# Patient Record
Sex: Female | Born: 1965 | Race: Asian | Hispanic: No | Marital: Married | Smoking: Never smoker
Health system: Southern US, Community
[De-identification: ages and names within clinical notes are randomized; demographics above are authoritative.]

## PROBLEM LIST (undated history)

## (undated) DIAGNOSIS — T7840XA Allergy, unspecified, initial encounter: Secondary | ICD-10-CM

## (undated) HISTORY — DX: Allergy, unspecified, initial encounter: T78.40XA

## (undated) HISTORY — PX: TUBAL LIGATION: SHX77

---

## 2009-03-09 ENCOUNTER — Ambulatory Visit: Payer: Self-pay | Admitting: Internal Medicine

## 2009-03-23 ENCOUNTER — Other Ambulatory Visit: Admission: RE | Admit: 2009-03-23 | Discharge: 2009-03-23 | Payer: Self-pay | Admitting: Endocrinology

## 2009-03-23 ENCOUNTER — Ambulatory Visit: Payer: Self-pay | Admitting: Internal Medicine

## 2009-03-23 LAB — CONVERTED CEMR LAB
Cholesterol: 192 mg/dL (ref 0–200)
HDL: 54.7 mg/dL (ref >39.00)
Pap Smear: NEGATIVE
Total CHOL/HDL Ratio: 4
VLDL: 17.2 mg/dL (ref 0.0–40.0)

## 2009-03-28 ENCOUNTER — Encounter: Payer: Self-pay | Admitting: Internal Medicine

## 2009-04-03 ENCOUNTER — Encounter: Admission: RE | Admit: 2009-04-03 | Discharge: 2009-04-03 | Payer: Self-pay | Admitting: Internal Medicine

## 2009-04-07 ENCOUNTER — Encounter: Payer: Self-pay | Admitting: Internal Medicine

## 2009-04-11 ENCOUNTER — Encounter: Admission: RE | Admit: 2009-04-11 | Discharge: 2009-04-11 | Payer: Self-pay | Admitting: Internal Medicine

## 2010-03-13 NOTE — Assessment & Plan Note (Signed)
Summary: CPX-LB   Vital Signs:  Patient profile:   45 year old female Menstrual status:  regular LMP:     02/18/2009 Height:      65 inches (165.10 cm) Weight:      147.25 pounds (66.93 kg) O2 Sat:      97 % on Room air Temp:     97.1 degrees F (36.17 degrees C) oral Pulse rate:   89 / minute Pulse rhythm:   regular Resp:     16 per minute BP sitting:   110 / 62  (left arm) Cuff size:   large  Vitals Entered By: Josph Macho CMA (March 23, 2009 10:42 AM)  O2 Flow:  Room air CC: Physical/ CF, Preventive Care LMP (date): 02/18/2009     Enter LMP: 02/18/2009   Primary Care Provider:  Etta Grandchild MD  CC:  Physical/ CF and Preventive Care.  History of Present Illness: She returns for a complete physical and states that URI symptoms have resolved.  Preventive Screening-Counseling & Management  Alcohol-Tobacco     Alcohol drinks/day: 0     Smoking Status: never  Hep-HIV-STD-Contraception     Hepatitis Risk: no risk noted     HIV Risk: no risk noted     STD Risk: no risk noted     SBE monthly: yes     SBE Education/Counseling: not applicable      Sexual History:  currently monogamous.        Drug Use:  no.        Blood Transfusions:  no.    Current Medications (verified): 1)  Tussionex Pennkinetic Er 8-10 Mg/91ml Lqcr (Chlorpheniramine-Hydrocodone) .... 2.5-5 Ml By Mouth Two Times A Day As Needed For Cough  Allergies (verified): No Known Drug Allergies  Past History:  Past Medical History: Reviewed history from 03/09/2009 and no changes required. Unremarkable  Past Surgical History: Reviewed history from 03/09/2009 and no changes required. Caesarean section  Family History: Reviewed history from 03/09/2009 and no changes required. Family History Hypertension  Social History: Reviewed history from 03/09/2009 and no changes required. 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  The patient denies anorexia, fever, weight loss, syncope, peripheral edema, prolonged cough, headaches, hemoptysis, abdominal pain, hematuria, suspicious skin lesions, difficulty walking, depression, enlarged lymph nodes, and breast masses.    Physical Exam  General:  Well-developed,well-nourished,in no acute distress; alert,appropriate and cooperative throughout examination Head:  Normocephalic and atraumatic without obvious abnormalities. No apparent alopecia or balding. Ears:  R ear normal and L ear normal.   Nose:  External nasal examination shows no deformity or inflammation. Nasal mucosa are pink and moist without lesions or exudates. Mouth:  Oral mucosa and oropharynx without lesions or exudates.  Teeth in good repair. Neck:  supple, full ROM, no masses, no thyromegaly, no JVD, normal carotid upstroke, and no carotid bruits.   Chest Wall:  No deformities, masses, or tenderness noted. Breasts:  No mass, nodules, thickening, tenderness, bulging, retraction, inflamation, nipple discharge or skin changes noted.   Lungs:  Normal respiratory effort, chest expands symmetrically. Lungs are clear to auscultation, no crackles or wheezes. Heart:  Normal rate and regular rhythm. S1 and S2 normal without gallop, murmur, click, rub or other extra sounds. Abdomen:  Bowel sounds positive,abdomen soft and non-tender without masses, organomegaly or hernias noted. Rectal:  No external abnormalities noted. Normal sphincter tone. No rectal masses or tenderness. Genitalia:  Normal introitus for age, no external lesions, no vaginal discharge, mucosa pink and moist, no vaginal or cervical lesions, no vaginal atrophy, no friaility or hemorrhage, normal uterus size and position, no adnexal masses or tenderness Msk:  No deformity or scoliosis noted of thoracic or lumbar spine.   Pulses:  R and L  carotid,radial,femoral,dorsalis pedis and posterior tibial pulses are full and equal bilaterally Extremities:  No clubbing, cyanosis, edema, or deformity noted with normal full range of motion of all joints.   Neurologic:  No cranial nerve deficits noted. Station and gait are normal. Plantar reflexes are down-going bilaterally. DTRs are symmetrical throughout. Sensory, motor and coordinative functions appear intact. Skin:  Intact without suspicious lesions or rashes Cervical Nodes:  no anterior cervical adenopathy and no posterior cervical adenopathy.   Axillary Nodes:  no R axillary adenopathy and no L axillary adenopathy.   Inguinal Nodes:  no R inguinal adenopathy and no L inguinal adenopathy.   Additional Exam:  EKG is normal.   Impression & Recommendations:  Problem # 1:  ROUTINE GENERAL MEDICAL EXAM@HEALTH  CARE FACL (ICD-V70.0) Assessment New  Discussed using sunscreen, use of alcohol, drug use, self breast exam, routine dental care, routine eye care, schedule for GYN exam, routine physical exam, seat belts, multiple vitamins, osteoporosis prevention, adequate calcium intake in diet, recommendations for immunizations, mammograms and Pap smears.  Discussed exercise and checking cholesterol.    Orders: Obtaining Screening PAP Smear (Q0091) Hemoccult Guaiac-1 spec.(in office) (82270) EKG w/ Interpretation (93000) Venipuncture ([address]) TLB-Lipid Panel (80061-LIPID) Radiology Referral (Radiology)  PAP Screening:    Reviewed PAP smear recommendations:  PAP smear done  Mammogram Screening:    Reviewed Mammogram recommendations:  mammogram ordered  Osteoporosis Risk Assessment:  Risk Factors for Fracture or Low Bone Density:   Race (White or Asian):     yes   Smoking status:       never  Patient Instructions: 1)  Please schedule a follow-up appointment as needed. 2)  It is important that you exercise regularly at least 20 minutes 5 times a week. If you develop chest pain, have  severe difficulty breathing, or feel very tired , stop exercising immediately and seek medical attention. 3)  You need to lose weight. Consider a lower calorie diet and regular exercise.  4)  Schedule your mammogram. 5)  You need to have a Pap Smear to prevent cervical cancer.

## 2010-03-13 NOTE — Letter (Signed)
Summary: Lipid Letter  [facility]  [address] [place], [place] [address]   Phone: [phone]  Fax: [phone]    03/23/2009  Ashley Mccann [address] [place], [place]  [address]  Dear Ashley Mccann:  We have carefully reviewed your last lipid profile from 03/23/2009 and the results are noted below with a summary of recommendations for lipid management.    Cholesterol:       192     Goal: <200   HDL "good" Cholesterol:   02.72     Goal: >40   LDL "bad" Cholesterol:   120     Goal: <130   Triglycerides:       86.0     Goal: <150        TLC Diet (Therapeutic Lifestyle Change): Saturated Fats & Transfatty acids should be kept < 7% of total calories ***Reduce Saturated Fats Polyunstaurated Fat can be up to 10% of total calories Monounsaturated Fat Fat can be up to 20% of total calories Total Fat should be no greater than 25-35% of total calories Carbohydrates should be 50-60% of total calories Protein should be approximately 15% of total calories Fiber should be at least 20-30 grams a day ***Increased fiber may help lower LDL Total Cholesterol should be < 200mg /day Consider adding plant stanol/sterols to diet (example: Benacol spread) ***A higher intake of unsaturated fat may reduce Triglycerides and Increase HDL    Adjunctive Measures (may lower LIPIDS and reduce risk of Heart Attack) include: Aerobic Exercise (20-30 minutes 3-4 times a week) Limit Alcohol Consumption Weight Reduction Aspirin 75-81 mg a day by mouth (if not allergic or contraindicated) Dietary Fiber 20-30 grams a day by mouth     Current Medications:  None If you have any questions, please call. We appreciate being able to work with you.   Sincerely,    [facility] Ashley Grandchild MD

## 2010-03-13 NOTE — Assessment & Plan Note (Signed)
Summary: NEW PT,BCBS,#-LB   Vital Signs:  Patient profile:   45 year old female Menstrual status:  regular LMP:     02/18/2009 Height:      65 inches Weight:      150 pounds BMI:     25.05 O2 Sat:      95 % on Room air Temp:     98.0 degrees F oral Pulse rate:   89 / minute Pulse rhythm:   regular Resp:     16 per minute BP sitting:   108 / 70  (left arm) Cuff size:   large  Vitals Entered By: Rock Nephew CMA (March 09, 2009 4:22 PM)  Nutrition Counseling: Patient's BMI is greater than 25 and therefore counseled on weight management options.  O2 Flow:  Room air  Primary Care Provider:  Etta Grandchild MD  CC:  Cough.  History of Present Illness:  Cough      This is a 45 year old woman who presents with Cough.  The symptoms began 3 weeks ago.  The intensity is described as mild.  The patient reports productive cough, but denies pleuritic chest pain, shortness of breath, wheezing, exertional dyspnea, fever, hemoptysis, and malaise.  Associated symtpoms include cold/URI symptoms.  The patient denies the following symptoms: sore throat, nasal congestion, chronic rhinitis, weight loss, acid reflux symptoms, and peripheral edema.  The cough is worse with cold exposure.    Preventive Screening-Counseling & Management  Alcohol-Tobacco     Alcohol drinks/day: 0     Smoking Status: never  Caffeine-Diet-Exercise     Does Patient Exercise: no  Hep-HIV-STD-Contraception     Hepatitis Risk: no risk noted     HIV Risk: no risk noted     STD Risk: no risk noted     SBE monthly: yes     SBE Education/Counseling: not applicable      Sexual History:  currently monogamous.        Drug Use:  no.        Blood Transfusions:  no.    Medications Prior to Update: 1)  None  Current Medications (verified): 1)  Zithromax Tri-Pak 500 Mg Tab (Azithromycin) .... Take As Directed Once Daily For 3 Days 2)  Tussionex Pennkinetic Er 8-10 Mg/19ml Lqcr (Chlorpheniramine-Hydrocodone) ....  2.5-5 Ml By Mouth Two Times A Day As Needed For Cough  Allergies (verified): No Known Drug Allergies  Past History:  Past Medical History: Unremarkable  Past Surgical History: Caesarean section  Family History: Family History Hypertension  Social History: Occupation: Transport planner at [employer] Never Smoked Alcohol use-no Drug use-no Regular exercise-no Smoking Status:  never Hepatitis Risk:  no risk noted HIV Risk:  no risk noted STD Risk:  no risk noted Sexual History:  currently monogamous Blood Transfusions:  no Drug Use:  no Does Patient Exercise:  no  Review of Systems       The patient complains of prolonged cough.  The patient denies anorexia, fever, weight loss, chest pain, peripheral edema, headaches, hemoptysis, abdominal pain, enlarged lymph nodes, and angioedema.    Physical Exam  General:  Well-developed,well-nourished,in no acute distress; alert,appropriate and cooperative throughout examination Head:  Normocephalic and atraumatic without obvious abnormalities. No apparent alopecia or balding. Ears:  R ear normal and L ear normal.   Mouth:  Oral mucosa and oropharynx without lesions or exudates.  Teeth in good repair. Neck:  supple, full ROM, no masses, no thyromegaly, no JVD, normal carotid upstroke, and no  carotid bruits.   Lungs:  Normal respiratory effort, chest expands symmetrically. Lungs are clear to auscultation, no crackles or wheezes. Heart:  Normal rate and regular rhythm. S1 and S2 normal without gallop, murmur, click, rub or other extra sounds. Abdomen:  soft, non-tender, normal bowel sounds, no distention, no masses, no guarding, no rigidity, no hepatomegaly, and no splenomegaly.   Msk:  No deformity or scoliosis noted of thoracic or lumbar spine.   Pulses:  R and L carotid,radial,femoral,dorsalis pedis and posterior tibial pulses are full and equal bilaterally Extremities:  No clubbing, cyanosis, edema, or deformity noted with normal full  range of motion of all joints.   Neurologic:  No cranial nerve deficits noted. Station and gait are normal. Plantar reflexes are down-going bilaterally. DTRs are symmetrical throughout. Sensory, motor and coordinative functions appear intact. Skin:  Intact without suspicious lesions or rashes Cervical Nodes:  no anterior cervical adenopathy and no posterior cervical adenopathy.   Axillary Nodes:  no R axillary adenopathy and no L axillary adenopathy.   Psych:  Cognition and judgment appear intact. Alert and cooperative with normal attention span and concentration. No apparent delusions, illusions, hallucinations   Impression & Recommendations:  Problem # 1:  BRONCHITIS-ACUTE (ICD-466.0) Assessment New  Her updated medication list for this problem includes:    Zithromax Tri-pak 500 Mg Tab (Azithromycin) .Marland Kitchen... Take as directed once daily for 3 days    Tussionex Pennkinetic Er 8-10 Mg/62ml Lqcr (Chlorpheniramine-hydrocodone) .Marland Kitchen... 2.5-5 ml by mouth two times a day as needed for cough  Take antibiotics and other medications as directed. Encouraged to push clear liquids, get enough rest, and take acetaminophen as needed. To be seen in 5-7 days if no improvement, sooner if worse.  Complete Medication List: 1)  Zithromax Tri-pak 500 Mg Tab (Azithromycin) .... Take as directed once daily for 3 days 2)  Tussionex Pennkinetic Er 8-10 Mg/61ml Lqcr (Chlorpheniramine-hydrocodone) .... 2.5-5 ml by mouth two times a day as needed for cough  Patient Instructions: 1)  Please schedule a follow-up appointment in 2 weeks. 2)  Take your antibiotic as prescribed until ALL of it is gone, but stop if you develop a rash or swelling and contact our office as soon as possible. 3)  Acute bronchitis symptoms for less than 10 days are not helped by antibiotics. take over the counter cough medications. call if no improvment in  5-7 days, sooner if increasing cough, fever, or new symptoms( shortness of breath, chest  pain). Prescriptions: TUSSIONEX PENNKINETIC ER 8-10 MG/5ML LQCR (CHLORPHENIRAMINE-HYDROCODONE) 2.5-5 ml by mouth two times a day as needed for cough  #4 ounces x 0   Entered and Authorized by:   Etta Grandchild MD   Signed by:   Etta Grandchild MD on 03/09/2009   Method used:   Print then Give to Patient   RxID:   [id number] ZITHROMAX TRI-PAK 500 MG TAB (AZITHROMYCIN) Take as directed once daily for 3 days  #3 x 0   Entered and Authorized by:   Etta Grandchild MD   Signed by:   Etta Grandchild MD on 03/09/2009   Method used:   Print then Give to Patient   RxID:   [id number]

## 2010-03-13 NOTE — Letter (Signed)
Summary: Results Follow-up Letter  [facility]  [address] [place], [place] [address]   Phone: [phone]  Fax: [phone]    03/28/2009  [address] [place], [place]  [address]  Dear Ms. MAW,   The following are the results of your recent test(s):  Test     Result     Pap Smear    Normal__x_____  Not Normal_____       Comments: _________________________________________________________ Cholesterol LDL(Bad cholesterol):          Your goal is less than:         HDL (Good cholesterol):        Your goal is more than: _________________________________________________________ Other Tests:   _________________________________________________________  Please call for an appointment Or _________________________________________________________ _________________________________________________________ _________________________________________________________  Sincerely,  Sanda Linger MD [facility]

## 2010-05-10 ENCOUNTER — Other Ambulatory Visit: Payer: Self-pay | Admitting: Internal Medicine

## 2010-06-12 ENCOUNTER — Encounter: Payer: Self-pay | Admitting: Internal Medicine

## 2010-06-14 ENCOUNTER — Encounter: Payer: Self-pay | Admitting: Internal Medicine

## 2010-06-14 ENCOUNTER — Other Ambulatory Visit (INDEPENDENT_AMBULATORY_CARE_PROVIDER_SITE_OTHER): Payer: BC Managed Care – PPO

## 2010-06-14 ENCOUNTER — Ambulatory Visit (INDEPENDENT_AMBULATORY_CARE_PROVIDER_SITE_OTHER): Payer: BC Managed Care – PPO | Admitting: Internal Medicine

## 2010-06-14 ENCOUNTER — Other Ambulatory Visit (HOSPITAL_COMMUNITY)
Admission: RE | Admit: 2010-06-14 | Discharge: 2010-06-14 | Disposition: A | Discharge status: Home / Self Care | Payer: BC Managed Care – PPO | Source: Ambulatory Visit | Attending: Internal Medicine | Admitting: Internal Medicine

## 2010-06-14 VITALS — BP 108/72 | HR 80 | Temp 98.3°F | Resp 16 | Ht 65.0 in | Wt 148.0 lb

## 2010-06-14 DIAGNOSIS — Z1231 Encounter for screening mammogram for malignant neoplasm of breast: Secondary | ICD-10-CM

## 2010-06-14 DIAGNOSIS — J309 Allergic rhinitis, unspecified: Secondary | ICD-10-CM

## 2010-06-14 DIAGNOSIS — Z0000 Encounter for general adult medical examination without abnormal findings: Secondary | ICD-10-CM

## 2010-06-14 DIAGNOSIS — Z01419 Encounter for gynecological examination (general) (routine) without abnormal findings: Secondary | ICD-10-CM | POA: Insufficient documentation

## 2010-06-14 LAB — LIPID PANEL
Cholesterol: 199 mg/dL (ref 0–200)
HDL: 51.1 mg/dL (ref >39.00)
LDL Cholesterol: 129 mg/dL — ABNORMAL HIGH (ref 0–99)
Total CHOL/HDL Ratio: 4

## 2010-06-14 LAB — COMPREHENSIVE METABOLIC PANEL
ALT: 20 U/L (ref 0–35)
AST: 19 U/L (ref 0–37)
Alkaline Phosphatase: 55 U/L (ref 39–117)
BUN: 17 mg/dL (ref 6–23)
Calcium: 8.8 mg/dL (ref 8.4–10.5)
Creatinine, Ser: 0.6 mg/dL (ref 0.4–1.2)
Glucose, Bld: 101 mg/dL — ABNORMAL HIGH (ref 70–99)
Potassium: 4.1 mEq/L (ref 3.5–5.1)
Total Bilirubin: 0.6 mg/dL (ref 0.3–1.2)

## 2010-06-14 LAB — CBC WITH DIFFERENTIAL/PLATELET
Basophils Relative: 0.4 % (ref 0.0–3.0)
Eosinophils Absolute: 0.1 10*3/uL (ref 0.0–0.7)
HCT: 38.7 % (ref 36.0–46.0)
Hemoglobin: 13.3 g/dL (ref 12.0–15.0)
Lymphs Abs: 3 10*3/uL (ref 0.7–4.0)
Monocytes Absolute: 0.4 10*3/uL (ref 0.1–1.0)
RDW: 13 % (ref 11.5–14.6)
WBC: 8.1 10*3/uL (ref 4.5–10.5)

## 2010-06-14 MED ORDER — FEXOFENADINE-PSEUDOEPHED ER 180-240 MG PO TB24: 1.0000 | ORAL_TABLET | Freq: Every day | ORAL | Status: AC

## 2010-06-14 NOTE — Assessment & Plan Note (Signed)
Exam done, labs and PAP smear ordered

## 2010-06-14 NOTE — Assessment & Plan Note (Signed)
mammo ordered.

## 2010-06-14 NOTE — Patient Instructions (Signed)
Allergic Rhinitis Allergic rhinitis is when the mucous membranes in the nose respond to allergens. Allergens are particles in the air that cause your body to have an allergic reaction. This causes you to release allergic antibodies. Through a chain of events, these eventually cause you to release histamine into the blood stream (hence the use of antihistamines). Although meant to be protective to the body, it is this release that causes your discomfort, such as frequent sneezing, congestion and an itchy runny nose.  CAUSES The pollen allergens may come from grasses, trees, and weeds. This is seasonal allergic rhinitis, or "hay fever." Other allergens cause year-round allergic rhinitis (perennial allergic rhinitis) such as house dust mite allergen, pet dander and mold spores.  SYMPTOMS  Nasal stuffiness (congestion).   Runny, itchy nose with sneezing and tearing of the eyes.   There is often an itching of the mouth, eyes and ears.  It cannot be cured, but it can be controlled with medications. DIAGNOSIS If you are unable to determine the offending allergen, skin or blood testing may find it. TREATMENT  Avoid the allergen.   Medications and allergy shots (immunotherapy) can help.   Hay fever may often be treated with antihistamines in pill or nasal spray forms. Antihistamines block the effects of histamine. There are over-the-counter medicines that may help with nasal congestion and swelling around the eyes. Check with your caregiver before taking or giving this medicine.  If the treatment above does not work, there are many new medications your caregiver can prescribe. Stronger medications may be used if initial measures are ineffective. Desensitizing injections can be used if medications and avoidance fails. Desensitization is when a patient is given ongoing shots until the body becomes less sensitive to the allergen. Make sure you follow up with your caregiver if problems continue. SEEK  MEDICAL CARE IF:   You develop fever (more than 100.5F (38.1 C).   You develop a cough that does not stop easily (persistent).   You have shortness of breath.   You start wheezing.   Symptoms interfere with normal daily activities.  Document Released: 10/23/2000 Document Re-Released: 02/19/2009 Exit[employer] Patient Information 2011 [employer]. 

## 2010-06-14 NOTE — Progress Notes (Signed)
Subjective:    Patient ID: Ashley Mccann, female    DOB: 02-08-1966, 45 y.o.   MRN: [id number]  Gynecologic Exam The patient's pertinent negatives include no genital itching, genital lesions, genital odor, genital rash, missed menses, pelvic pain, vaginal bleeding or vaginal discharge. The patient is experiencing no pain. She is not pregnant. Pertinent negatives include no abdominal pain, anorexia, back pain, chills, constipation, diarrhea, discolored urine, dysuria, fever, flank pain, frequency, headaches, hematuria, joint pain, joint swelling, nausea, painful intercourse, rash, sore throat, urgency or vomiting. She is sexually active. No, her partner does not have an STD. She uses tubal ligation for contraception. Her menstrual history has been regular.  Allergic Reaction This is a chronic problem. The current episode started more than 1 week ago. The problem occurs intermittently. The problem has been gradually worsening since onset. The problem is mild. It is unknown what she was exposed to. The time of exposure is unknown. Associated symptoms include eye itching and eye redness. Pertinent negatives include no abdominal pain, chest pain, chest pressure, coughing, diarrhea, difficulty breathing, drooling, globus sensation, hyperventilation, itching, rash, stridor, trouble swallowing, vomiting or wheezing. There is no swelling present. Past treatments include nothing. Her past medical history is significant for seasonal allergies. There is no history of asthma, atopic dermatitis, food allergies or medication allergies.      Review of Systems  Constitutional: Negative for fever, chills, diaphoresis, activity change, appetite change, fatigue and unexpected weight change.  HENT: Positive for congestion, rhinorrhea, sneezing, postnasal drip and tinnitus. Negative for hearing loss, ear pain, nosebleeds, sore throat, facial swelling, drooling, mouth sores, trouble swallowing, neck pain, neck stiffness, dental  problem, voice change, sinus pressure and ear discharge.   Eyes: Positive for redness and itching. Negative for photophobia, pain, discharge and visual disturbance.  Respiratory: Negative for apnea, cough, choking, chest tightness, shortness of breath, wheezing and stridor.   Cardiovascular: Negative for chest pain.  Gastrointestinal: Negative for nausea, vomiting, abdominal pain, diarrhea, constipation, blood in stool, abdominal distention, anal bleeding, rectal pain and anorexia.  Genitourinary: Negative for dysuria, urgency, frequency, hematuria, flank pain, decreased urine volume, vaginal bleeding, vaginal discharge, enuresis, difficulty urinating, pelvic pain, dyspareunia and missed menses.  Musculoskeletal: Negative for myalgias, back pain, joint pain, joint swelling, arthralgias and gait problem.  Skin: Negative for color change, itching, pallor and rash.  Neurological: Negative for dizziness, tremors, seizures, syncope, facial asymmetry, speech difficulty, weakness, light-headedness, numbness and headaches.  Hematological: Negative for adenopathy. Does not bruise/bleed easily.  Psychiatric/Behavioral: Negative for suicidal ideas, hallucinations, behavioral problems, confusion, sleep disturbance, self-injury, dysphoric mood, decreased concentration and agitation. The patient is not nervous/anxious and is not hyperactive.        Objective:   Physical Exam  Vitals reviewed. Constitutional: She is oriented to person, place, and time. She appears well-developed and well-nourished. No distress.  HENT:  Head: Normocephalic and atraumatic.  Right Ear: Hearing, external ear and ear canal normal.  Left Ear: Hearing, tympanic membrane, external ear and ear canal normal.  Nose: Mucosal edema and rhinorrhea present. No nose lacerations, sinus tenderness, nasal deformity, septal deviation or nasal septal hematoma. No epistaxis.  No foreign bodies. Right sinus exhibits no maxillary sinus tenderness  and no frontal sinus tenderness. Left sinus exhibits no maxillary sinus tenderness and no frontal sinus tenderness.  Mouth/Throat: Uvula is midline, oropharynx is clear and moist and mucous membranes are normal. Mucous membranes are not pale, not dry and not cyanotic. No oropharyngeal exudate, posterior oropharyngeal edema, posterior oropharyngeal erythema  or tonsillar abscesses.  Eyes: Conjunctivae and EOM are normal. Pupils are equal, round, and reactive to light. Right eye exhibits no discharge. Left eye exhibits no discharge. No scleral icterus.  Neck: Normal range of motion. Neck supple. No JVD present. No tracheal deviation present. No thyromegaly present.  Cardiovascular: Normal rate, regular rhythm, normal heart sounds and intact distal pulses.  Exam reveals no gallop and no friction rub.   No murmur heard. Pulmonary/Chest: Effort normal and breath sounds normal. No stridor. No respiratory distress. She has no wheezes. She has no rales. She exhibits no tenderness.  Abdominal: Soft. Bowel sounds are normal. She exhibits no distension and no mass. There is no tenderness. There is no rebound and no guarding. Hernia confirmed negative in the right inguinal area and confirmed negative in the left inguinal area.  Genitourinary: Rectum normal, vagina normal and uterus normal. Rectal exam shows no external hemorrhoid, no internal hemorrhoid, no fissure, no mass, no tenderness and anal tone normal. Guaiac negative stool. No breast swelling, tenderness, discharge or bleeding. Pelvic exam was performed with patient supine. No labial fusion. There is no rash, tenderness, lesion or injury on the right labia. There is no rash, tenderness, lesion or injury on the left labia. Uterus is not deviated, not enlarged, not fixed and not tender. Cervix exhibits no motion tenderness, no discharge and no friability. Right adnexum displays no mass, no tenderness and no fullness. Left adnexum displays no mass, no tenderness  and no fullness. No erythema, tenderness or bleeding around the vagina. No foreign body around the vagina. No signs of injury around the vagina. No vaginal discharge found.  Musculoskeletal: Normal range of motion. She exhibits no edema and no tenderness.  Lymphadenopathy:    She has no cervical adenopathy.       Right: No inguinal adenopathy present.       Left: No inguinal adenopathy present.  Neurological: She is alert and oriented to person, place, and time. She has normal reflexes. She displays normal reflexes. No cranial nerve deficit. Coordination normal.  Skin: Skin is warm and dry. No rash noted. She is not diaphoretic. No erythema. No pallor.  Psychiatric: She has a normal mood and affect. Her behavior is normal. Judgment normal.          Assessment & Plan:

## 2010-06-14 NOTE — Assessment & Plan Note (Signed)
Start allerga-d prn

## 2010-06-19 ENCOUNTER — Encounter: Payer: Self-pay | Admitting: Internal Medicine

## 2011-07-26 ENCOUNTER — Other Ambulatory Visit (INDEPENDENT_AMBULATORY_CARE_PROVIDER_SITE_OTHER): Payer: BC Managed Care – PPO

## 2011-07-26 ENCOUNTER — Encounter: Payer: Self-pay | Admitting: Internal Medicine

## 2011-07-26 ENCOUNTER — Ambulatory Visit (INDEPENDENT_AMBULATORY_CARE_PROVIDER_SITE_OTHER): Payer: BC Managed Care – PPO | Admitting: Internal Medicine

## 2011-07-26 VITALS — BP 118/82 | HR 64 | Temp 98.3°F | Resp 16 | Ht 64.0 in | Wt 148.0 lb

## 2011-07-26 DIAGNOSIS — R7309 Other abnormal glucose: Secondary | ICD-10-CM

## 2011-07-26 DIAGNOSIS — Z0000 Encounter for general adult medical examination without abnormal findings: Secondary | ICD-10-CM

## 2011-07-26 DIAGNOSIS — Z1231 Encounter for screening mammogram for malignant neoplasm of breast: Secondary | ICD-10-CM

## 2011-07-26 DIAGNOSIS — E119 Type 2 diabetes mellitus without complications: Secondary | ICD-10-CM | POA: Insufficient documentation

## 2011-07-26 LAB — CBC WITH DIFFERENTIAL/PLATELET
Eosinophils Absolute: 0.1 10*3/uL (ref 0.0–0.7)
Eosinophils Relative: 1.4 % (ref 0.0–5.0)
HCT: 43.6 % (ref 36.0–46.0)
Hemoglobin: 14.5 g/dL (ref 12.0–15.0)
Lymphocytes Relative: 37.5 % (ref 12.0–46.0)
Lymphs Abs: 3.1 10*3/uL (ref 0.7–4.0)
MCV: 88.5 fl (ref 78.0–100.0)
Monocytes Relative: 6.2 % (ref 3.0–12.0)
Neutro Abs: 4.5 10*3/uL (ref 1.4–7.7)
RDW: 13 % (ref 11.5–14.6)
WBC: 8.3 10*3/uL (ref 4.5–10.5)

## 2011-07-26 LAB — LIPID PANEL
Cholesterol: 197 mg/dL (ref 0–200)
LDL Cholesterol: 125 mg/dL — ABNORMAL HIGH (ref 0–99)
Total CHOL/HDL Ratio: 4
VLDL: 18.8 mg/dL (ref 0.0–40.0)

## 2011-07-26 LAB — COMPREHENSIVE METABOLIC PANEL
ALT: 20 U/L (ref 0–35)
AST: 17 U/L (ref 0–37)
Albumin: 4 g/dL (ref 3.5–5.2)
Alkaline Phosphatase: 56 U/L (ref 39–117)
Calcium: 9.2 mg/dL (ref 8.4–10.5)
GFR: 95.8 mL/min (ref >60.00)
Sodium: 139 mEq/L (ref 135–145)
Total Protein: 7.7 g/dL (ref 6.0–8.3)

## 2011-07-26 LAB — TSH: TSH: 1.55 u[IU]/mL (ref 0.35–5.50)

## 2011-07-26 LAB — HEMOGLOBIN A1C: Hgb A1c MFr Bld: 6.9 % — ABNORMAL HIGH (ref 4.6–6.5)

## 2011-07-26 NOTE — Assessment & Plan Note (Signed)
Mammogram ordered, exam done, labs ordered, vaccines were addressed, pt ed material was given

## 2011-07-26 NOTE — Patient Instructions (Addendum)
Preventive Care for Adults, Female A healthy lifestyle and preventive care can promote health and wellness. Preventive health guidelines for women include the following key practices.  A routine yearly physical is a good way to check with your caregiver about your health and preventive screening. It is a chance to share any concerns and updates on your health, and to receive a thorough exam.   Visit your dentist for a routine exam and preventive care every 6 months. Brush your teeth twice a day and floss once a day. Good oral hygiene prevents tooth decay and gum disease.   The frequency of eye exams is based on your age, health, family medical history, use of contact lenses, and other factors. Follow your caregiver's recommendations for frequency of eye exams.   Eat a healthy diet. Foods like vegetables, fruits, whole grains, low-fat dairy products, and lean protein foods contain the nutrients you need without too many calories. Decrease your intake of foods high in solid fats, added sugars, and salt. Eat the right amount of calories for you.Get information about a proper diet from your caregiver, if necessary.   Regular physical exercise is one of the most important things you can do for your health. Most adults should get at least 150 minutes of moderate-intensity exercise (any activity that increases your heart rate and causes you to sweat) each week. In addition, most adults need muscle-strengthening exercises on 2 or more days a week.   Maintain a healthy weight. The body mass index (BMI) is a screening tool to identify possible weight problems. It provides an estimate of body fat based on height and weight. Your caregiver can help determine your BMI, and can help you achieve or maintain a healthy weight.For adults 20 years and older:   A BMI below 18.5 is considered underweight.   A BMI of 18.5 to 24.9 is normal.   A BMI of 25 to 29.9 is considered overweight.   A BMI of 30 and above is  considered obese.   Maintain normal blood lipids and cholesterol levels by exercising and minimizing your intake of saturated fat. Eat a balanced diet with plenty of fruit and vegetables. Blood tests for lipids and cholesterol should begin at age 20 and be repeated every 5 years. If your lipid or cholesterol levels are high, you are over 50, or you are at high risk for heart disease, you may need your cholesterol levels checked more frequently.Ongoing high lipid and cholesterol levels should be treated with medicines if diet and exercise are not effective.   If you smoke, find out from your caregiver how to quit. If you do not use tobacco, do not start.   If you are pregnant, do not drink alcohol. If you are breastfeeding, be very cautious about drinking alcohol. If you are not pregnant and choose to drink alcohol, do not exceed 1 drink per day. One drink is considered to be 12 ounces (355 mL) of beer, 5 ounces (148 mL) of wine, or 1.5 ounces (44 mL) of liquor.   Avoid use of street drugs. Do not share needles with anyone. Ask for help if you need support or instructions about stopping the use of drugs.   High blood pressure causes heart disease and increases the risk of stroke. Your blood pressure should be checked at least every 1 to 2 years. Ongoing high blood pressure should be treated with medicines if weight loss and exercise are not effective.   If you are 55 to 46   years old, ask your caregiver if you should take aspirin to prevent strokes.   Diabetes screening involves taking a blood sample to check your fasting blood sugar level. This should be done once every 3 years, after age 45, if you are within normal weight and without risk factors for diabetes. Testing should be considered at a younger age or be carried out more frequently if you are overweight and have at least 1 risk factor for diabetes.   Breast cancer screening is essential preventive care for women. You should practice "breast  self-awareness." This means understanding the normal appearance and feel of your breasts and may include breast self-examination. Any changes detected, no matter how small, should be reported to a caregiver. Women in their 20s and 30s should have a clinical breast exam (CBE) by a caregiver as part of a regular health exam every 1 to 3 years. After age 40, women should have a CBE every year. Starting at age 40, women should consider having a mammography (breast X-ray test) every year. Women who have a family history of breast cancer should talk to their caregiver about genetic screening. Women at a high risk of breast cancer should talk to their caregivers about having magnetic resonance imaging (MRI) and a mammography every year.   The Pap test is a screening test for cervical cancer. A Pap test can show cell changes on the cervix that might become cervical cancer if left untreated. A Pap test is a procedure in which cells are obtained and examined from the lower end of the uterus (cervix).   Women should have a Pap test starting at age 21.   Between ages 21 and 29, Pap tests should be repeated every 2 years.   Beginning at age 30, you should have a Pap test every 3 years as long as the past 3 Pap tests have been normal.   Some women have medical problems that increase the chance of getting cervical cancer. Talk to your caregiver about these problems. It is especially important to talk to your caregiver if a new problem develops soon after your last Pap test. In these cases, your caregiver may recommend more frequent screening and Pap tests.   The above recommendations are the same for women who have or have not gotten the vaccine for human papillomavirus (HPV).   If you had a hysterectomy for a problem that was not cancer or a condition that could lead to cancer, then you no longer need Pap tests. Even if you no longer need a Pap test, a regular exam is a good idea to make sure no other problems are  starting.   If you are between ages 65 and 70, and you have had normal Pap tests going back 10 years, you no longer need Pap tests. Even if you no longer need a Pap test, a regular exam is a good idea to make sure no other problems are starting.   If you have had past treatment for cervical cancer or a condition that could lead to cancer, you need Pap tests and screening for cancer for at least 20 years after your treatment.   If Pap tests have been discontinued, risk factors (such as a new sexual partner) need to be reassessed to determine if screening should be resumed.   The HPV test is an additional test that may be used for cervical cancer screening. The HPV test looks for the virus that can cause the cell changes on the cervix.   The cells collected during the Pap test can be tested for HPV. The HPV test could be used to screen women aged 30 years and older, and should be used in women of any age who have unclear Pap test results. After the age of 30, women should have HPV testing at the same frequency as a Pap test.   Colorectal cancer can be detected and often prevented. Most routine colorectal cancer screening begins at the age of 50 and continues through age 75. However, your caregiver may recommend screening at an earlier age if you have risk factors for colon cancer. On a yearly basis, your caregiver may provide home test kits to check for hidden blood in the stool. Use of a small camera at the end of a tube, to directly examine the colon (sigmoidoscopy or colonoscopy), can detect the earliest forms of colorectal cancer. Talk to your caregiver about this at age 50, when routine screening begins. Direct examination of the colon should be repeated every 5 to 10 years through age 75, unless early forms of pre-cancerous polyps or small growths are found.   Hepatitis C blood testing is recommended for all people born from 1945 through 1965 and any individual with known risks for hepatitis C.    Practice safe sex. Use condoms and avoid high-risk sexual practices to reduce the spread of sexually transmitted infections (STIs). STIs include gonorrhea, chlamydia, syphilis, trichomonas, herpes, HPV, and human immunodeficiency virus (HIV). Herpes, HIV, and HPV are viral illnesses that have no cure. They can result in disability, cancer, and death. Sexually active women aged 25 and younger should be checked for chlamydia. Older women with new or multiple partners should also be tested for chlamydia. Testing for other STIs is recommended if you are sexually active and at increased risk.   Osteoporosis is a disease in which the bones lose minerals and strength with aging. This can result in serious bone fractures. The risk of osteoporosis can be identified using a bone density scan. Women ages 65 and over and women at risk for fractures or osteoporosis should discuss screening with their caregivers. Ask your caregiver whether you should take a calcium supplement or vitamin D to reduce the rate of osteoporosis.   Menopause can be associated with physical symptoms and risks. Hormone replacement therapy is available to decrease symptoms and risks. You should talk to your caregiver about whether hormone replacement therapy is right for you.   Use sunscreen with sun protection factor (SPF) of 30 or more. Apply sunscreen liberally and repeatedly throughout the day. You should seek shade when your shadow is shorter than you. Protect yourself by wearing long sleeves, pants, a wide-brimmed hat, and sunglasses year round, whenever you are outdoors.   Once a month, do a whole body skin exam, using a mirror to look at the skin on your back. Notify your caregiver of new moles, moles that have irregular borders, moles that are larger than a pencil eraser, or moles that have changed in shape or color.   Stay current with required immunizations.   Influenza. You need a dose every fall (or winter). The composition of  the flu vaccine changes each year, so being vaccinated once is not enough.   Pneumococcal polysaccharide. You need 1 to 2 doses if you smoke cigarettes or if you have certain chronic medical conditions. You need 1 dose at age 65 (or older) if you have never been vaccinated.   Tetanus, diphtheria, pertussis (Tdap, Td). Get 1 dose of   Tdap vaccine if you are younger than age 65, are over 65 and have contact with an infant, are a healthcare worker, are pregnant, or simply want to be protected from whooping cough. After that, you need a Td booster dose every 10 years. Consult your caregiver if you have not had at least 3 tetanus and diphtheria-containing shots sometime in your life or have a deep or dirty wound.   HPV. You need this vaccine if you are a woman age 26 or younger. The vaccine is given in 3 doses over 6 months.   Measles, mumps, rubella (MMR). You need at least 1 dose of MMR if you were born in 1957 or later. You may also need a second dose.   Meningococcal. If you are age 19 to 21 and a first-year college student living in a residence hall, or have one of several medical conditions, you need to get vaccinated against meningococcal disease. You may also need additional booster doses.   Zoster (shingles). If you are age 60 or older, you should get this vaccine.   Varicella (chickenpox). If you have never had chickenpox or you were vaccinated but received only 1 dose, talk to your caregiver to find out if you need this vaccine.   Hepatitis A. You need this vaccine if you have a specific risk factor for hepatitis A virus infection or you simply wish to be protected from this disease. The vaccine is usually given as 2 doses, 6 to 18 months apart.   Hepatitis B. You need this vaccine if you have a specific risk factor for hepatitis B virus infection or you simply wish to be protected from this disease. The vaccine is given in 3 doses, usually over 6 months.  Preventive Services /  Frequency Ages 19 to 39  Blood pressure check.** / Every 1 to 2 years.   Lipid and cholesterol check.** / Every 5 years beginning at age 20.   Clinical breast exam.** / Every 3 years for women in their 20s and 30s.   Pap test.** / Every 2 years from ages 21 through 29. Every 3 years starting at age 30 through age 65 or 70 with a history of 3 consecutive normal Pap tests.   HPV screening.** / Every 3 years from ages 30 through ages 65 to 70 with a history of 3 consecutive normal Pap tests.   Hepatitis C blood test.** / For any individual with known risks for hepatitis C.   Skin self-exam. / Monthly.   Influenza immunization.** / Every year.   Pneumococcal polysaccharide immunization.** / 1 to 2 doses if you smoke cigarettes or if you have certain chronic medical conditions.   Tetanus, diphtheria, pertussis (Tdap, Td) immunization. / A one-time dose of Tdap vaccine. After that, you need a Td booster dose every 10 years.   HPV immunization. / 3 doses over 6 months, if you are 26 and younger.   Measles, mumps, rubella (MMR) immunization. / You need at least 1 dose of MMR if you were born in 1957 or later. You may also need a second dose.   Meningococcal immunization. / 1 dose if you are age 19 to 21 and a first-year college student living in a residence hall, or have one of several medical conditions, you need to get vaccinated against meningococcal disease. You may also need additional booster doses.   Varicella immunization.** / Consult your caregiver.   Hepatitis A immunization.** / Consult your caregiver. 2 doses, 6 to 18 months   apart.   Hepatitis B immunization.** / Consult your caregiver. 3 doses usually over 6 months.  Ages 40 to 64  Blood pressure check.** / Every 1 to 2 years.   Lipid and cholesterol check.** / Every 5 years beginning at age 20.   Clinical breast exam.** / Every year after age 40.   Mammogram.** / Every year beginning at age 40 and continuing for as  long as you are in good health. Consult with your caregiver.   Pap test.** / Every 3 years starting at age 30 through age 65 or 70 with a history of 3 consecutive normal Pap tests.   HPV screening.** / Every 3 years from ages 30 through ages 65 to 70 with a history of 3 consecutive normal Pap tests.   Fecal occult blood test (FOBT) of stool. / Every year beginning at age 50 and continuing until age 75. You may not need to do this test if you get a colonoscopy every 10 years.   Flexible sigmoidoscopy or colonoscopy.** / Every 5 years for a flexible sigmoidoscopy or every 10 years for a colonoscopy beginning at age 50 and continuing until age 75.   Hepatitis C blood test.** / For all people born from 1945 through 1965 and any individual with known risks for hepatitis C.   Skin self-exam. / Monthly.   Influenza immunization.** / Every year.   Pneumococcal polysaccharide immunization.** / 1 to 2 doses if you smoke cigarettes or if you have certain chronic medical conditions.   Tetanus, diphtheria, pertussis (Tdap, Td) immunization.** / A one-time dose of Tdap vaccine. After that, you need a Td booster dose every 10 years.   Measles, mumps, rubella (MMR) immunization. / You need at least 1 dose of MMR if you were born in 1957 or later. You may also need a second dose.   Varicella immunization.** / Consult your caregiver.   Meningococcal immunization.** / Consult your caregiver.   Hepatitis A immunization.** / Consult your caregiver. 2 doses, 6 to 18 months apart.   Hepatitis B immunization.** / Consult your caregiver. 3 doses, usually over 6 months.  Ages 65 and over  Blood pressure check.** / Every 1 to 2 years.   Lipid and cholesterol check.** / Every 5 years beginning at age 20.   Clinical breast exam.** / Every year after age 40.   Mammogram.** / Every year beginning at age 40 and continuing for as long as you are in good health. Consult with your caregiver.   Pap test.** /  Every 3 years starting at age 30 through age 65 or 70 with a 3 consecutive normal Pap tests. Testing can be stopped between 65 and 70 with 3 consecutive normal Pap tests and no abnormal Pap or HPV tests in the past 10 years.   HPV screening.** / Every 3 years from ages 30 through ages 65 or 70 with a history of 3 consecutive normal Pap tests. Testing can be stopped between 65 and 70 with 3 consecutive normal Pap tests and no abnormal Pap or HPV tests in the past 10 years.   Fecal occult blood test (FOBT) of stool. / Every year beginning at age 50 and continuing until age 75. You may not need to do this test if you get a colonoscopy every 10 years.   Flexible sigmoidoscopy or colonoscopy.** / Every 5 years for a flexible sigmoidoscopy or every 10 years for a colonoscopy beginning at age 50 and continuing until age 75.   Hepatitis   C blood test.** / For all people born from 1945 through 1965 and any individual with known risks for hepatitis C.   Osteoporosis screening.** / A one-time screening for women ages 65 and over and women at risk for fractures or osteoporosis.   Skin self-exam. / Monthly.   Influenza immunization.** / Every year.   Pneumococcal polysaccharide immunization.** / 1 dose at age 65 (or older) if you have never been vaccinated.   Tetanus, diphtheria, pertussis (Tdap, Td) immunization. / A one-time dose of Tdap vaccine if you are over 65 and have contact with an infant, are a healthcare worker, or simply want to be protected from whooping cough. After that, you need a Td booster dose every 10 years.   Varicella immunization.** / Consult your caregiver.   Meningococcal immunization.** / Consult your caregiver.   Hepatitis A immunization.** / Consult your caregiver. 2 doses, 6 to 18 months apart.   Hepatitis B immunization.** / Check with your caregiver. 3 doses, usually over 6 months.  ** Family history and personal history of risk and conditions may change your caregiver's  recommendations. Document Released: 03/26/2001 Document Revised: 01/17/2011 Document Reviewed: 06/25/2010 [employer] Patient Information 2012 [employer]. 

## 2011-07-26 NOTE — Progress Notes (Signed)
  Subjective:    Patient ID: Ashley Mccann, female    DOB: [birth year], 46 y.o.   MRN: [id number]  HPI She returns for a physical and offers no complaints.   Review of Systems  Constitutional: Negative for fever, chills, diaphoresis, activity change, appetite change, fatigue and unexpected weight change.  HENT: Negative.   Eyes: Negative.   Respiratory: Negative for apnea, cough, choking, chest tightness, shortness of breath, wheezing and stridor.   Cardiovascular: Negative for chest pain, palpitations and leg swelling.  Gastrointestinal: Negative for nausea, vomiting, abdominal pain, diarrhea, constipation, blood in stool and abdominal distention.  Genitourinary: Negative.  Negative for vaginal bleeding, vaginal discharge, vaginal pain and menstrual problem.  Musculoskeletal: Negative for myalgias, back pain, joint swelling, arthralgias and gait problem.  Skin: Negative for color change, pallor, rash and wound.  Neurological: Negative.   Hematological: Negative for adenopathy. Does not bruise/bleed easily.  Psychiatric/Behavioral: Negative.        Objective:   Physical Exam  Vitals reviewed. Constitutional: She is oriented to person, place, and time. She appears well-developed and well-nourished. No distress.  HENT:  Head: Normocephalic and atraumatic.  Mouth/Throat: Oropharynx is clear and moist. No oropharyngeal exudate.  Eyes: Conjunctivae and EOM are normal. Pupils are equal, round, and reactive to light. Right eye exhibits no discharge. Left eye exhibits no discharge. No scleral icterus.  Neck: Normal range of motion. Neck supple. No JVD present. No tracheal deviation present. No thyromegaly present.  Cardiovascular: Normal rate, regular rhythm, normal heart sounds and intact distal pulses.  Exam reveals no gallop and no friction rub.   No murmur heard. Pulmonary/Chest: Effort normal and breath sounds normal. No accessory muscle usage or stridor. Not tachypneic. No respiratory  distress. She has no wheezes. She has no rales. Chest wall is not dull to percussion. She exhibits no mass, no tenderness, no bony tenderness, no laceration, no crepitus, no edema, no deformity, no swelling and no retraction. Right breast exhibits no inverted nipple, no mass, no nipple discharge, no skin change and no tenderness. Left breast exhibits no inverted nipple, no mass, no nipple discharge, no skin change and no tenderness. Breasts are symmetrical.  Abdominal: Soft. Bowel sounds are normal. She exhibits no distension and no mass. There is no tenderness. There is no rebound and no guarding.  Musculoskeletal: Normal range of motion. She exhibits no edema and no tenderness.  Lymphadenopathy:    She has no cervical adenopathy.  Neurological: She is oriented to person, place, and time.  Skin: Skin is warm and dry. No rash noted. She is not diaphoretic. No erythema. No pallor.  Psychiatric: She has a normal mood and affect. Her behavior is normal. Judgment and thought content normal.      Lab Results  Component Value Date   WBC 8.1 06/14/2010   HGB 13.3 06/14/2010   HCT 38.7 06/14/2010   PLT 295.0 06/14/2010   GLUCOSE 101* 06/14/2010   CHOL 199 06/14/2010   TRIG 96.0 06/14/2010   HDL 51.10 06/14/2010   LDLCALC 129* 06/14/2010   ALT 20 06/14/2010   AST 19 06/14/2010   NA 135 06/14/2010   K 4.1 06/14/2010   CL 101 06/14/2010   CREATININE 0.6 06/14/2010   BUN 17 06/14/2010   CO2 24 06/14/2010   TSH 0.84 06/14/2010      Assessment & Plan:

## 2011-07-26 NOTE — Assessment & Plan Note (Signed)
I will recheck her BMP and a1c today

## 2011-08-29 ENCOUNTER — Encounter (HOSPITAL_COMMUNITY): Payer: Self-pay

## 2011-08-29 ENCOUNTER — Ambulatory Visit (HOSPITAL_COMMUNITY)
Admission: RE | Admit: 2011-08-29 | Discharge: 2011-08-29 | Disposition: A | Discharge status: Home / Self Care | Payer: BC Managed Care – PPO | Source: Ambulatory Visit | Attending: Internal Medicine | Admitting: Internal Medicine

## 2011-08-29 DIAGNOSIS — Z1231 Encounter for screening mammogram for malignant neoplasm of breast: Secondary | ICD-10-CM | POA: Insufficient documentation

## 2011-08-30 LAB — HM MAMMOGRAPHY: HM Mammogram: NORMAL

## 2012-08-18 ENCOUNTER — Other Ambulatory Visit: Payer: Self-pay | Admitting: Internal Medicine

## 2012-08-18 ENCOUNTER — Ambulatory Visit (INDEPENDENT_AMBULATORY_CARE_PROVIDER_SITE_OTHER): Payer: BC Managed Care – PPO | Admitting: Internal Medicine

## 2012-08-18 ENCOUNTER — Encounter: Payer: Self-pay | Admitting: Internal Medicine

## 2012-08-18 VITALS — BP 138/72 | HR 94 | Temp 98.4°F | Ht 64.0 in | Wt 141.0 lb

## 2012-08-18 DIAGNOSIS — Z1231 Encounter for screening mammogram for malignant neoplasm of breast: Secondary | ICD-10-CM

## 2012-08-18 DIAGNOSIS — J069 Acute upper respiratory infection, unspecified: Secondary | ICD-10-CM

## 2012-08-18 MED ORDER — HYDROCODONE-HOMATROPINE 5-1.5 MG/5ML PO SYRP: 5.0000 mL | ORAL_SOLUTION | Freq: Three times a day (TID) | ORAL | Status: DC | PRN

## 2012-08-18 NOTE — Patient Instructions (Signed)

## 2012-08-18 NOTE — Progress Notes (Signed)
HPI  Pt presents to the clinic today with c/ocold symptoms x 4 days. The worst part is the sore throat and dry cough. She does not produce any sputum. She denies fever, chills or body aches. She has tried cough syrup and cough drops and nothing seems to help. She has a history of allergies or asthma. She does not have sick contacts.  Review of Systems     History reviewed. No pertinent past medical history.  Family History  Problem Relation Age of Onset  . Hypertension Other     History   Social History  . Marital Status: Married    Spouse Name: N/A    Number of Children: N/A  . Years of Education: N/A   Occupational History  . 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 Smoking status: Never Smoker   . Smokeless tobacco: Not on file  . Alcohol Use: No  . Drug Use: No  . Sexually Active: Yes    Birth Control/ Protection: Surgical   Other Topics Concern  . Not on file   Social History Narrative   Regular Exercise -  NO          No Known Allergies   Constitutional: Positive headache, fatigue. Denies fever or abrupt weight changes.  HEENT:  Positive sore throat. Denies eye redness, eye pain, pressure behind the eyes, facial pain, nasal congestion, ear pain, ringing in the ears, wax buildup, runny nose or bloody nose. Respiratory: Positive cough. Denies difficulty breathing or shortness of breath.  Cardiovascular: Denies chest pain, chest tightness, palpitations or swelling in the hands or feet.   No other specific complaints in a complete review of systems (except as listed in HPI above).  Objective:   BP 138/72  Pulse 94  Temp(Src) 98.4 F (36.9 C) (Oral)  Ht 5\' 4"  (1.626 m)  Wt 141 lb (63.957 kg)  BMI 24.19 kg/m2  SpO2 97%  LMP 08/15/2012 Wt Readings from Last 3 Encounters:  08/18/12 141 lb (63.957 kg)  07/26/11 148 lb (67.132 kg)  06/14/10 148 lb (67.132 kg)     General: Appears her stated age, well developed, well nourished in  NAD. HEENT: Head: normal shape and size; Eyes: sclera white, no icterus, conjunctiva pink, PERRLA and EOMs intact; Ears: Tm's gray and intact, normal light reflex; Nose: mucosa pink and moist, septum midline; Throat/Mouth: + PND. Teeth present, mucosa erythematous and moist, no exudate noted, no lesions or ulcerations noted.  Neck: Mild cervical lymphadenopathy. Neck supple, trachea midline. No massses, lumps or thyromegaly present.  Cardiovascular: Normal rate and rhythm. S1,S2 noted.  No murmur, rubs or gallops noted. No JVD or BLE edema. No carotid bruits noted. Pulmonary/Chest: Normal effort and positive vesicular breath sounds. No respiratory distress. No wheezes, rales or ronchi noted.      Assessment & Plan:   Upper Respiratory Infection, likely viral, new onset:  Get some rest and drink plenty of water Do salt water gargles for the sore throat eRx for Hycodan cough syrup  RTC as needed or if symptoms persist.

## 2012-08-31 ENCOUNTER — Ambulatory Visit (HOSPITAL_COMMUNITY)
Admission: RE | Admit: 2012-08-31 | Discharge: 2012-08-31 | Disposition: A | Discharge status: Home / Self Care | Payer: BC Managed Care – PPO | Source: Ambulatory Visit | Attending: Internal Medicine | Admitting: Internal Medicine

## 2012-08-31 DIAGNOSIS — Z1231 Encounter for screening mammogram for malignant neoplasm of breast: Secondary | ICD-10-CM | POA: Insufficient documentation

## 2013-09-06 ENCOUNTER — Other Ambulatory Visit: Payer: Self-pay | Admitting: Internal Medicine

## 2013-09-06 DIAGNOSIS — Z1231 Encounter for screening mammogram for malignant neoplasm of breast: Secondary | ICD-10-CM

## 2013-09-23 ENCOUNTER — Ambulatory Visit (HOSPITAL_COMMUNITY)
Admission: RE | Admit: 2013-09-23 | Discharge: 2013-09-23 | Disposition: A | Discharge status: Home / Self Care | Payer: BC Managed Care – PPO | Source: Ambulatory Visit | Attending: Internal Medicine | Admitting: Internal Medicine

## 2013-09-23 DIAGNOSIS — Z1231 Encounter for screening mammogram for malignant neoplasm of breast: Secondary | ICD-10-CM | POA: Insufficient documentation

## 2013-09-23 LAB — HM MAMMOGRAPHY: HM MAMMO: NORMAL

## 2014-02-06 ENCOUNTER — Encounter (HOSPITAL_COMMUNITY): Payer: Self-pay | Admitting: *Deleted

## 2014-02-06 ENCOUNTER — Emergency Department (HOSPITAL_COMMUNITY)
Admission: EM | Admit: 2014-02-06 | Discharge: 2014-02-06 | Disposition: A | Discharge status: Home / Self Care | Payer: BC Managed Care – PPO | Attending: Emergency Medicine | Admitting: Emergency Medicine

## 2014-02-06 DIAGNOSIS — L0291 Cutaneous abscess, unspecified: Secondary | ICD-10-CM

## 2014-02-06 DIAGNOSIS — Z791 Long term (current) use of non-steroidal anti-inflammatories (NSAID): Secondary | ICD-10-CM | POA: Diagnosis not present

## 2014-02-06 DIAGNOSIS — L0231 Cutaneous abscess of buttock: Secondary | ICD-10-CM | POA: Insufficient documentation

## 2014-02-06 MED ORDER — HYDROCODONE-ACETAMINOPHEN 5-325 MG PO TABS
1.0000 | ORAL_TABLET | Freq: Once | ORAL | Status: AC
  Administered 2014-02-06: 1 via ORAL
  Filled 2014-02-06: qty 1

## 2014-02-06 MED ORDER — LIDOCAINE-EPINEPHRINE (PF) 2 %-1:200000 IJ SOLN
10.0000 mL | Freq: Once | INTRAMUSCULAR | Status: AC
  Administered 2014-02-06: 10 mL via INTRADERMAL
  Filled 2014-02-06: qty 20

## 2014-02-06 MED ORDER — SODIUM BICARBONATE 4 % IV SOLN
5.0000 mL | Freq: Once | INTRAVENOUS | Status: AC
  Administered 2014-02-06: 5 mL via SUBCUTANEOUS
  Filled 2014-02-06: qty 5

## 2014-02-06 MED ORDER — CEPHALEXIN 500 MG PO CAPS: 500.0000 mg | ORAL_CAPSULE | Freq: Three times a day (TID) | ORAL | Status: DC

## 2014-02-06 MED ORDER — HYDROCODONE-ACETAMINOPHEN 5-325 MG PO TABS: 2.0000 | ORAL_TABLET | ORAL | Status: DC | PRN

## 2014-02-06 MED ORDER — ONDANSETRON HCL 4 MG PO TABS: 4.0000 mg | ORAL_TABLET | Freq: Four times a day (QID) | ORAL | Status: DC

## 2014-02-06 MED ORDER — CEPHALEXIN 250 MG PO CAPS
250.0000 mg | ORAL_CAPSULE | Freq: Once | ORAL | Status: AC
  Administered 2014-02-06: 250 mg via ORAL
  Filled 2014-02-06: qty 1

## 2014-02-06 MED ORDER — ONDANSETRON 4 MG PO TBDP
4.0000 mg | ORAL_TABLET | Freq: Once | ORAL | Status: AC
  Administered 2014-02-06: 4 mg via ORAL
  Filled 2014-02-06: qty 1

## 2014-02-06 NOTE — ED Notes (Signed)
Pt reports buttocks abscess x4 days. Pain 9/10. Has been taking motrin with no relief.

## 2014-02-06 NOTE — ED Provider Notes (Signed)
CSN: [id number]     Arrival date & time 02/06/14  0957 History   First MD Initiated Contact with Patient 02/06/14 1009     Chief Complaint  Patient presents with  . Abscess     (Consider location/radiation/quality/duration/timing/severity/associated sxs/prior Treatment) HPI   Patient to the ER with complaints of pain to her buttocks for 4 days. She denies ever having this before or difficulty with bowel movement. She describes it as a painful bump. She has used Motrin without any relief. She has not had any abdominal pain, nausea, diarrhea, or fevers.  History reviewed. No pertinent past medical history. Past Surgical History  Procedure Laterality Date  . Cesarean section    . Tubal ligation     Family History  Problem Relation Age of Onset  . Hypertension Other    History  Substance Use Topics  . Smoking status: Never Smoker   . Smokeless tobacco: Not on file  . Alcohol Use: No   OB History    No data available     Review of Systems  10 Systems reviewed and are negative for acute change except as noted in the HPI.     Allergies  Review of patient's allergies indicates no known allergies.  Home Medications   Prior to Admission medications   Medication Sig Start Date End Date Taking? Authorizing Provider  ibuprofen (ADVIL,MOTRIN) 200 MG tablet Take 400 mg by mouth 3 (three) times daily.   Yes Historical Provider, MD  cephALEXin (KEFLEX) 500 MG capsule Take 1 capsule (500 mg total) by mouth 3 (three) times daily. 02/06/14   Zyhir Cappella Marilu Favre, PA-C  HYDROcodone-acetaminophen (NORCO/VICODIN) 5-325 MG per tablet Take 2 tablets by mouth every 4 (four) hours as needed for moderate pain or severe pain. 02/06/14   Sajid Ruppert Marilu Favre, PA-C  HYDROcodone-homatropine (HYCODAN) 5-1.5 MG/5ML syrup Take 5 mLs by mouth every 8 (eight) hours as needed for cough. Patient not taking: Reported on 02/06/2014 08/18/12   Jearld Fenton, NP  ondansetron (ZOFRAN) 4 MG tablet Take 1 tablet (4 mg  total) by mouth every 6 (six) hours. 02/06/14   Arlayne Liggins Marilu Favre, PA-C   BP 122/71 mmHg  Pulse 84  Temp(Src) 98.1 F (36.7 C) (Oral)  Resp 16  SpO2 98% Physical Exam  Constitutional: She appears well-developed and well-nourished. No distress.  HENT:  Head: Normocephalic and atraumatic.  Eyes: Pupils are equal, round, and reactive to light.  Neck: Normal range of motion. Neck supple.  Cardiovascular: Normal rate and regular rhythm.   Pulmonary/Chest: Effort normal.  Abdominal: Soft.  Genitourinary:     Neurological: She is alert.  Skin: Skin is warm and dry.  Nursing note and vitals reviewed.   ED Course  Procedures (including critical care time) Labs Review Labs Reviewed - No data to display  Imaging Review No results found.   EKG Interpretation None      MDM   Final diagnoses:  Abscess    INCISION AND DRAINAGE Performed by: Linus Mako Consent: Verbal consent obtained. Risks and benefits: risks, benefits and alternatives were discussed Type: abscess  Body area: gluteal  Anesthesia: local infiltration  Incision was made with a scalpel.  Local anesthetic: lidocaine 2% with epinephrine  Anesthetic total: 2 ml  Complexity: complex Blunt dissection to break up loculations  Drainage: purulent  Drainage amount: very minimal  Patient tolerance: Patient tolerated the procedure well with no immediate complications.   The patient had a small abscess, it does not appear that the  abscess fluid has collected into an easily drain able abscess, and I&D was attempted with minimal discharge. Patient will be started on abx and given strict return precautions.  48 y.o.Ashley Mccann's evaluation in the Emergency Department is complete. It has been determined that no acute conditions requiring further emergency intervention are present at this time. The patient/guardian have been advised of the diagnosis and plan. We have discussed signs and symptoms that warrant  return to the ED, such as changes or worsening in symptoms.  Vital signs are stable at discharge. Filed Vitals:   02/06/14 1006  BP: 122/71  Pulse: 84  Temp: 98.1 F (36.7 C)  Resp: 16    Patient/guardian has voiced understanding and agreed to follow-up with the PCP or specialist.     Linus Mako, PA-C 02/06/14 [place], MD 02/06/14 [phone]

## 2014-02-06 NOTE — ED Notes (Signed)
Bed: WTR8 Expected date:  Expected time:  Means of arrival:  Comments: 

## 2014-02-06 NOTE — Discharge Instructions (Signed)

## 2014-02-09 ENCOUNTER — Ambulatory Visit (INDEPENDENT_AMBULATORY_CARE_PROVIDER_SITE_OTHER): Payer: BC Managed Care – PPO | Admitting: Internal Medicine

## 2014-02-09 ENCOUNTER — Encounter: Payer: Self-pay | Admitting: Internal Medicine

## 2014-02-09 VITALS — BP 110/76 | HR 90 | Temp 98.2°F | Resp 15 | Ht 60.0 in | Wt 138.8 lb

## 2014-02-09 DIAGNOSIS — L03317 Cellulitis of buttock: Secondary | ICD-10-CM

## 2014-02-09 DIAGNOSIS — E119 Type 2 diabetes mellitus without complications: Secondary | ICD-10-CM

## 2014-02-09 LAB — GLUCOSE, POCT (MANUAL RESULT ENTRY): POC GLUCOSE: 83 mg/dL (ref 70–99)

## 2014-02-09 MED ORDER — MUPIROCIN 2 % EX OINT: TOPICAL_OINTMENT | CUTANEOUS | Status: DC

## 2014-02-09 NOTE — Progress Notes (Signed)
   Subjective:    Patient ID: Ashley Mccann, female    DOB: 19-Aug-1965, 48 y.o.   MRN: [id number]  HPI  She was seen in emergency room 02/06/14 with pain in the buttocks. She was found to have a 2 x 3 cm abscess on the left buttocks. Incision and drainage was attempted but scant material was obtained. It was described as pus like.No microbiology testing was performed  She was placed on narcotic pain medicines and cephalexin and told to follow-up with her primary care doctor.  There is no definite predisposition for this; except review of the chart reveals an A1c of 6.9% in June 2013. She is on no diabetic medications..    Review of Systems  She denies fever, chills,  sweats or weight loss  She's had no diarrhea or abdominal pain  She denies dysuria, pyuria or hematuria  She's had no vaginal discharge or drainage.  She denies polyuria urea, polydipsia, polyphagia.      Objective:   Physical Exam  Pertinent or positive findings include: She is very uncomfortable due to pain in the left medial buttocks. There is a very tender area of cellulitis which is firm to touch measuring 5 x 3 cm. There is no abscess formation.  General appearance :adequately nourished; in no distress. Eyes: No conjunctival inflammation or scleral icterus is present. Oral exam: Dental hygiene is good. Lips and gums are healthy appearing.There is no oropharyngeal erythema or exudate noted.  Heart:  Normal rate and regular rhythm. S1 and S2 normal without gallop, murmur, click, rub or other extra sounds   Lungs:Chest clear to auscultation; no wheezes, rhonchi,rales ,or rubs present.No increased work of breathing.  Abdomen: bowel sounds normal, soft and non-tender without masses, organomegaly or hernias noted.  No guarding or rebound.  Vascular : all pulses equal ; no bruits present. Skin:Warm & dry.  Intact without suspicious  rashes ; no jaundice or tenting Lymphatic: No lymphadenopathy is noted about the  head, neck, axilla          Assessment & Plan:  #1 cellulitis L medial buttock #2 DM, ? Control Plan: random glucose was 83; A1c will be checked

## 2014-02-09 NOTE — Progress Notes (Signed)
Pre visit review using our clinic review tool, if applicable. No additional management support is needed unless otherwise documented below in the visit note. 

## 2014-02-09 NOTE — Patient Instructions (Addendum)
   Take the antibiotic every 6 hours while awake. The pain medication can also be taken every 6 hours as needed.  Referral will be made to a General Surgeon; if an abscess forms it will need to be drained.  Soak your buttocks in Sitz bath filled with hot water four times a day and then apply the antibiotic ointment  Go to the emergency room if you have increasing pain, progression of the cellulitis or high fever.

## 2014-02-11 ENCOUNTER — Emergency Department (HOSPITAL_COMMUNITY)
Admission: EM | Admit: 2014-02-11 | Discharge: 2014-02-11 | Disposition: A | Discharge status: Home / Self Care | Payer: BLUE CROSS/BLUE SHIELD | Attending: Emergency Medicine | Admitting: Emergency Medicine

## 2014-02-11 ENCOUNTER — Emergency Department (HOSPITAL_COMMUNITY): Payer: BLUE CROSS/BLUE SHIELD

## 2014-02-11 ENCOUNTER — Encounter (HOSPITAL_COMMUNITY): Payer: Self-pay | Admitting: Emergency Medicine

## 2014-02-11 DIAGNOSIS — Z791 Long term (current) use of non-steroidal anti-inflammatories (NSAID): Secondary | ICD-10-CM | POA: Diagnosis not present

## 2014-02-11 DIAGNOSIS — L02215 Cutaneous abscess of perineum: Secondary | ICD-10-CM | POA: Diagnosis not present

## 2014-02-11 DIAGNOSIS — Z79899 Other long term (current) drug therapy: Secondary | ICD-10-CM | POA: Diagnosis not present

## 2014-02-11 DIAGNOSIS — L03315 Cellulitis of perineum: Secondary | ICD-10-CM | POA: Diagnosis present

## 2014-02-11 LAB — BASIC METABOLIC PANEL
ANION GAP: 7 (ref 5–15)
BUN: 12 mg/dL (ref 6–23)
CALCIUM: 9.1 mg/dL (ref 8.4–10.5)
CHLORIDE: 106 meq/L (ref 96–112)
CO2: 26 mmol/L (ref 19–32)
CREATININE: 0.54 mg/dL (ref 0.50–1.10)
Glucose, Bld: 129 mg/dL — ABNORMAL HIGH (ref 70–99)
Potassium: 3.3 mmol/L — ABNORMAL LOW (ref 3.5–5.1)
Sodium: 139 mmol/L (ref 135–145)

## 2014-02-11 LAB — I-STAT CHEM 8, ED
BUN: 10 mg/dL (ref 6–23)
CALCIUM ION: 1.22 mmol/L (ref 1.12–1.23)
CHLORIDE: 102 meq/L (ref 96–112)
Creatinine, Ser: 0.5 mg/dL (ref 0.50–1.10)
GLUCOSE: 126 mg/dL — AB (ref 70–99)
HEMATOCRIT: 39 % (ref 36.0–46.0)
Hemoglobin: 13.3 g/dL (ref 12.0–15.0)
Potassium: 3.4 mmol/L — ABNORMAL LOW (ref 3.5–5.1)
Sodium: 139 mmol/L (ref 135–145)
TCO2: 22 mmol/L (ref 0–100)

## 2014-02-11 LAB — I-STAT CG4 LACTIC ACID, ED: LACTIC ACID, VENOUS: 0.78 mmol/L (ref 0.5–2.2)

## 2014-02-11 LAB — CBC WITH DIFFERENTIAL/PLATELET
BASOS PCT: 0 % (ref 0–1)
Basophils Absolute: 0 10*3/uL (ref 0.0–0.1)
EOS ABS: 0 10*3/uL (ref 0.0–0.7)
Eosinophils Relative: 0 % (ref 0–5)
HEMATOCRIT: 37.7 % (ref 36.0–46.0)
Hemoglobin: 12.6 g/dL (ref 12.0–15.0)
Lymphocytes Relative: 10 % — ABNORMAL LOW (ref 12–46)
Lymphs Abs: 1.5 10*3/uL (ref 0.7–4.0)
MCH: 29.4 pg (ref 26.0–34.0)
MCHC: 33.4 g/dL (ref 30.0–36.0)
MCV: 88.1 fL (ref 78.0–100.0)
MONO ABS: 0.9 10*3/uL (ref 0.1–1.0)
Monocytes Relative: 6 % (ref 3–12)
Neutro Abs: 12.5 10*3/uL — ABNORMAL HIGH (ref 1.7–7.7)
Neutrophils Relative %: 84 % — ABNORMAL HIGH (ref 43–77)
PLATELETS: 286 10*3/uL (ref 150–400)
RBC: 4.28 MIL/uL (ref 3.87–5.11)
RDW: 12.2 % (ref 11.5–15.5)
WBC: 15 10*3/uL — ABNORMAL HIGH (ref 4.0–10.5)

## 2014-02-11 MED ORDER — AMOXICILLIN-POT CLAVULANATE 875-125 MG PO TABS: 1.0000 | ORAL_TABLET | Freq: Two times a day (BID) | ORAL | Status: DC

## 2014-02-11 MED ORDER — AMOXICILLIN-POT CLAVULANATE 875-125 MG PO TABS
1.0000 | ORAL_TABLET | Freq: Once | ORAL | Status: AC
  Administered 2014-02-11: 1 via ORAL
  Filled 2014-02-11: qty 1

## 2014-02-11 MED ORDER — OXYCODONE-ACETAMINOPHEN 5-325 MG PO TABS: ORAL_TABLET | ORAL | Status: DC

## 2014-02-11 MED ORDER — LIDOCAINE HCL 1 % IJ SOLN
30.0000 mL | Freq: Once | INTRAMUSCULAR | Status: AC
  Administered 2014-02-11: 30 mL via INTRADERMAL
  Filled 2014-02-11: qty 40

## 2014-02-11 MED ORDER — MORPHINE SULFATE 4 MG/ML IJ SOLN
4.0000 mg | Freq: Once | INTRAMUSCULAR | Status: AC
  Administered 2014-02-11: 4 mg via INTRAVENOUS
  Filled 2014-02-11: qty 1

## 2014-02-11 MED ORDER — IOHEXOL 300 MG/ML  SOLN
100.0000 mL | Freq: Once | INTRAMUSCULAR | Status: AC | PRN
  Administered 2014-02-11: 100 mL via INTRAVENOUS

## 2014-02-11 MED ORDER — SODIUM CHLORIDE 0.9 % IV BOLUS (SEPSIS)
1000.0000 mL | Freq: Once | INTRAVENOUS | Status: AC
  Administered 2014-02-11: 1000 mL via INTRAVENOUS

## 2014-02-11 MED ORDER — ONDANSETRON HCL 4 MG/2ML IJ SOLN
4.0000 mg | Freq: Once | INTRAMUSCULAR | Status: AC
  Administered 2014-02-11: 4 mg via INTRAVENOUS
  Filled 2014-02-11: qty 2

## 2014-02-11 NOTE — Discharge Instructions (Signed)
Take percocet for breakthrough pain, do not drink alcohol, drive, care for children or do other critical tasks while taking percocet.  Stopped taking Keflex and start taking Augmentin.  You can continue to take Zofran for nausea.  Follow with your appointment at [facility] surgery on Monday.  You need to take sitz bath after you urinate or have a bowel movement.  Do not hesitate to return to the emergency room for any new, worsening or concerning symptoms

## 2014-02-11 NOTE — ED Provider Notes (Signed)
CSN: [id number]     Arrival date & time 02/11/14  1109 History   First MD Initiated Contact with Patient 02/11/14 1144     Chief Complaint  Patient presents with  . Cellulitis     (Consider location/radiation/quality/duration/timing/severity/associated sxs/prior Treatment) HPI   Ashley Mccann is a 49 y.o. female complaining of worsening pain to perirectal abscess. She states the pain is worsening and the abscess is growing and going more anteriorly. Patient was seen and I and D performed with scant amount of discharge, patient has appointment for evaluation at [facility] surgery in 2 days. She's been compliant with her medications, she notes subjective fever chills and nausea, no documented fever, no actual emesis. No history of diabetes. Patient rates her pain is severe, 8 out of 10, exacerbated by movement and palpation. She's been taking Keflex and Zofran at home.   History reviewed. No pertinent past medical history. Past Surgical History  Procedure Laterality Date  . Cesarean section    . Tubal ligation     Family History  Problem Relation Age of Onset  . Hypertension Other    History  Substance Use Topics  . Smoking status: Never Smoker   . Smokeless tobacco: Not on file  . Alcohol Use: No   OB History    No data available     Review of Systems  10 systems reviewed and found to be negative, except as noted in the HPI.   Allergies  Review of patient's allergies indicates no known allergies.  Home Medications   Prior to Admission medications   Medication Sig Start Date End Date Taking? Authorizing Provider  HYDROcodone-homatropine (HYCODAN) 5-1.5 MG/5ML syrup Take 5 mLs by mouth every 8 (eight) hours as needed for cough. 08/18/12  Yes Jearld Fenton, NP  ibuprofen (ADVIL,MOTRIN) 200 MG tablet Take 400 mg by mouth 3 (three) times daily.   Yes Historical Provider, MD  Multiple Vitamin (MULTIVITAMIN WITH MINERALS) TABS tablet Take 1 tablet by mouth daily.   Yes  Historical Provider, MD  mupirocin ointment (BACTROBAN) 2 % Applied to the affected area after the Sitz baths Patient taking differently: Apply 1 application topically daily. Applied to the affected area after the Sitz baths 02/09/14  Yes Hendricks Limes, MD  ondansetron (ZOFRAN) 4 MG tablet Take 1 tablet (4 mg total) by mouth every 6 (six) hours. 02/06/14  Yes Tiffany Marilu Favre, PA-C  amoxicillin-clavulanate (AUGMENTIN) 875-125 MG per tablet Take 1 tablet by mouth 2 (two) times daily. One tab po bid x 10 days 02/11/14   Elmyra Ricks Shady Bradish, PA-C  oxyCODONE-acetaminophen (PERCOCET/ROXICET) 5-325 MG per tablet 1 to 2 tabs PO q6hrs  PRN for pain 02/11/14   Elmyra Ricks Briele Lagasse, PA-C   BP 105/70 mmHg  Pulse 75  Temp(Src) 97.9 F (36.6 C) (Oral)  Resp 18  SpO2 100%  LMP 01/27/2014 Physical Exam  Constitutional: She is oriented to person, place, and time. She appears well-developed and well-nourished. No distress.  HENT:  Head: Normocephalic and atraumatic.  Mouth/Throat: Oropharynx is clear and moist.  Eyes: Conjunctivae and EOM are normal. Pupils are equal, round, and reactive to light.  Neck: Normal range of motion.  Cardiovascular: Normal rate, regular rhythm and intact distal pulses.   Pulmonary/Chest: Effort normal. No stridor.  Genitourinary:     Musculoskeletal: Normal range of motion.  Neurological: She is alert and oriented to person, place, and time.  Psychiatric: She has a normal mood and affect.  Nursing note and vitals reviewed.  ED Course  INCISION AND DRAINAGE Date/Time: 02/11/2014 3:09 PM Performed by: Monico Blitz Authorized by: Monico Blitz Consent: Verbal consent obtained. Consent given by: patient Patient identity confirmed: verbally with patient Type: abscess Body area: anogenital Location details: perineum Anesthesia: local infiltration Local anesthetic: lidocaine 1% without epinephrine Anesthetic total: 5 ml Scalpel size: 11 Incision type:  Cruciate. Drainage: purulent and  bloody Drainage amount: copious Wound treatment: wound left open Packing material: none Patient tolerance: Patient tolerated the procedure well with no immediate complications   (including critical care time) Labs Review Labs Reviewed  CBC WITH DIFFERENTIAL - Abnormal; Notable for the following:    WBC 15.0 (*)    Neutrophils Relative % 84 (*)    Neutro Abs 12.5 (*)    Lymphocytes Relative 10 (*)    All other components within normal limits  BASIC METABOLIC PANEL - Abnormal; Notable for the following:    Potassium 3.3 (*)    Glucose, Bld 129 (*)    All other components within normal limits  I-STAT CHEM 8, ED - Abnormal; Notable for the following:    Potassium 3.4 (*)    Glucose, Bld 126 (*)    All other components within normal limits  I-STAT CG4 LACTIC ACID, ED    Imaging Review Ct Pelvis W Contrast  02/11/2014   CLINICAL DATA:  Left-sided peroneal/buttock pain and swelling.  EXAM: CT PELVIS WITH CONTRAST  TECHNIQUE: Multidetector CT imaging of the pelvis was performed using the standard protocol following the bolus administration of intravenous contrast.  CONTRAST:  115mL OMNIPAQUE IOHEXOL 300 MG/ML  SOLN  COMPARISON:  None.  FINDINGS: There is a 3.3 cm subcutaneous abscess in the left upper buttock area/perineum. 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 Significant surrounding inflammation/ enhancement/cellulitis. No perirectal abscess or intrapelvic abscess.  The uterus and ovaries are unremarkable. There is a simple appearing left ovarian cyst. The bladder is normal. The appendix is normal. No pelvic mass, adenopathy or free pelvic fluid collections. No inguinal mass or adenopathy.  IMPRESSION: 3.3 cm subcutaneous abscess in the left upper buttock/peroneal area. 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  No significant intrapelvic abnormalities.   Electronically Signed   By: Kalman Jewels M.D.   On: 02/11/2014 13:35     EKG  Interpretation None      MDM   Final diagnoses:  Perineal abscess    Filed Vitals:   02/11/14 1119 02/11/14 1435 02/11/14 1613  BP: 120/80 101/62 105/70  Pulse: 100 78 75  Temp: 98.1 F (36.7 C) 97.9 F (36.6 C)   TempSrc: Oral Oral   Resp: 18 18 18   SpO2: 100% 100% 100%    Medications  morphine 4 MG/ML injection 4 mg (4 mg Intravenous Given 02/11/14 1234)  ondansetron (ZOFRAN) injection 4 mg (4 mg Intravenous Given 02/11/14 1234)  sodium chloride 0.9 % bolus 1,000 mL (0 mLs Intravenous Stopped 02/11/14 1435)  lidocaine (XYLOCAINE) 1 % (with pres) injection 30 mL (30 mLs Intradermal Given 02/11/14 1235)  iohexol (OMNIPAQUE) 300 MG/ML solution 100 mL (100 mLs Intravenous Contrast Given 02/11/14 1318)  morphine 4 MG/ML injection 4 mg (4 mg Intravenous Given 02/11/14 1541)  ondansetron (ZOFRAN) injection 4 mg (4 mg Intravenous Given 02/11/14 1541)  amoxicillin-clavulanate (AUGMENTIN) 875-125 MG per tablet 1 tablet (1 tablet Oral Given 02/11/14 1541)    Vallorie Loisel is a pleasant 49 y.o. female presenting with worsening perianal abscess. Patient has white count of 15, pointing abscess, normal lactate. CT shows  a 3 cm subcutaneous abscess in the perineum with possible perianal fistula. No perirectal or intrapelvic abscess.  Case discussed with Dr. Harlow Asa who recommends performing incision and drainage, starting her on back Augmentin and she will follow with [facility] at her preop appointment on Monday.  I and D performed with cruciate excision and expression of a large volume of purulent material. I've advised the patient that she will need to perform sitz bath after every urination or bowel movement. Patient will be transitioned to Augmentin. Patient will also follow with [facility] at her appointment on Monday.  Evaluation does not show pathology that would require ongoing emergent intervention or inpatient treatment. Pt is hemodynamically stable and mentating appropriately. Discussed findings and plan with  patient/guardian, who agrees with care plan. All questions answered. Return precautions discussed and outpatient follow up given.   Discharge Medication List as of 02/11/2014  3:48 PM    START taking these medications   Details  amoxicillin-clavulanate (AUGMENTIN) 875-125 MG per tablet Take 1 tablet by mouth 2 (two) times daily. One tab po bid x 10 days, Starting 02/11/2014, Until Discontinued, Print    oxyCODONE-acetaminophen (PERCOCET/ROXICET) 5-325 MG per tablet 1 to 2 tabs PO q6hrs  PRN for pain, Print             Monico Blitz, PA-C 02/11/14 1747  Dot Lanes, MD 02/17/14 1316

## 2014-02-11 NOTE — ED Notes (Signed)
Pt presents w/ dx of cellulitis to lt buttock (low near vulva).  Pt states she is scheduled for surgery on it on Monday but cannot deal with the pain.  No fever or vomiting.

## 2014-08-04 ENCOUNTER — Telehealth: Payer: Self-pay

## 2014-08-04 NOTE — Telephone Encounter (Signed)
Left Vm on home and also on cell phone for pt to come in and get her HgA1C rechecked.

## 2014-10-19 ENCOUNTER — Other Ambulatory Visit: Payer: Self-pay | Admitting: Internal Medicine

## 2014-10-19 DIAGNOSIS — Z1231 Encounter for screening mammogram for malignant neoplasm of breast: Secondary | ICD-10-CM

## 2014-10-26 ENCOUNTER — Ambulatory Visit (HOSPITAL_COMMUNITY)
Admission: RE | Admit: 2014-10-26 | Discharge: 2014-10-26 | Disposition: A | Discharge status: Home / Self Care | Payer: BLUE CROSS/BLUE SHIELD | Source: Ambulatory Visit | Attending: Internal Medicine | Admitting: Internal Medicine

## 2014-10-26 DIAGNOSIS — Z1231 Encounter for screening mammogram for malignant neoplasm of breast: Secondary | ICD-10-CM | POA: Diagnosis not present

## 2014-10-26 LAB — HM MAMMOGRAPHY: HM MAMMO: NORMAL

## 2015-06-23 ENCOUNTER — Ambulatory Visit (INDEPENDENT_AMBULATORY_CARE_PROVIDER_SITE_OTHER): Payer: BLUE CROSS/BLUE SHIELD | Admitting: Internal Medicine

## 2015-06-23 ENCOUNTER — Encounter: Payer: Self-pay | Admitting: Internal Medicine

## 2015-06-23 ENCOUNTER — Other Ambulatory Visit (INDEPENDENT_AMBULATORY_CARE_PROVIDER_SITE_OTHER): Payer: BLUE CROSS/BLUE SHIELD

## 2015-06-23 VITALS — BP 114/78 | HR 74 | Temp 98.7°F | Resp 16 | Wt 142.0 lb

## 2015-06-23 DIAGNOSIS — Z23 Encounter for immunization: Secondary | ICD-10-CM

## 2015-06-23 DIAGNOSIS — E119 Type 2 diabetes mellitus without complications: Secondary | ICD-10-CM

## 2015-06-23 DIAGNOSIS — Z114 Encounter for screening for human immunodeficiency virus [HIV]: Secondary | ICD-10-CM

## 2015-06-23 DIAGNOSIS — Z0000 Encounter for general adult medical examination without abnormal findings: Secondary | ICD-10-CM

## 2015-06-23 LAB — CBC WITH DIFFERENTIAL/PLATELET
BASOS PCT: 0.4 % (ref 0.0–3.0)
Basophils Absolute: 0 10*3/uL (ref 0.0–0.1)
EOS ABS: 0.1 10*3/uL (ref 0.0–0.7)
Eosinophils Relative: 1.2 % (ref 0.0–5.0)
HCT: 40.9 % (ref 36.0–46.0)
HEMOGLOBIN: 13.9 g/dL (ref 12.0–15.0)
Lymphocytes Relative: 38.2 % (ref 12.0–46.0)
Lymphs Abs: 2.7 10*3/uL (ref 0.7–4.0)
MCHC: 33.9 g/dL (ref 30.0–36.0)
MCV: 87.1 fl (ref 78.0–100.0)
MONO ABS: 0.4 10*3/uL (ref 0.1–1.0)
Monocytes Relative: 5 % (ref 3.0–12.0)
Neutro Abs: 3.8 10*3/uL (ref 1.4–7.7)
Neutrophils Relative %: 55.2 % (ref 43.0–77.0)
PLATELETS: 303 10*3/uL (ref 150.0–400.0)
RBC: 4.7 Mil/uL (ref 3.87–5.11)
RDW: 12.5 % (ref 11.5–15.5)
WBC: 7 10*3/uL (ref 4.0–10.5)

## 2015-06-23 LAB — COMPREHENSIVE METABOLIC PANEL
ALBUMIN: 4.3 g/dL (ref 3.5–5.2)
ALT: 13 U/L (ref 0–35)
AST: 13 U/L (ref 0–37)
Alkaline Phosphatase: 45 U/L (ref 39–117)
BUN: 21 mg/dL (ref 6–23)
CHLORIDE: 105 meq/L (ref 96–112)
CO2: 25 meq/L (ref 19–32)
CREATININE: 0.6 mg/dL (ref 0.40–1.20)
Calcium: 9 mg/dL (ref 8.4–10.5)
GFR: 112.57 mL/min (ref >60.00)
GLUCOSE: 127 mg/dL — AB (ref 70–99)
Potassium: 4.3 mEq/L (ref 3.5–5.1)
SODIUM: 138 meq/L (ref 135–145)
Total Bilirubin: 0.5 mg/dL (ref 0.2–1.2)
Total Protein: 7.3 g/dL (ref 6.0–8.3)

## 2015-06-23 LAB — MICROALBUMIN / CREATININE URINE RATIO
Creatinine,U: 65.6 mg/dL
Microalb Creat Ratio: 1.1 mg/g (ref 0.0–30.0)
Microalb, Ur: <0.7 mg/dL (ref 0.0–1.9)

## 2015-06-23 LAB — LIPID PANEL
CHOL/HDL RATIO: 4
Cholesterol: 192 mg/dL (ref 0–200)
HDL: 49.7 mg/dL (ref >39.00)
LDL CALC: 131 mg/dL — AB (ref 0–99)
NONHDL: 142.6
Triglycerides: 59 mg/dL (ref 0.0–149.0)
VLDL: 11.8 mg/dL (ref 0.0–40.0)

## 2015-06-23 LAB — HIV ANTIBODY (ROUTINE TESTING W REFLEX): HIV 1&2 Ab, 4th Generation: NONREACTIVE

## 2015-06-23 LAB — HEMOGLOBIN A1C: Hgb A1c MFr Bld: 6.9 % — ABNORMAL HIGH (ref 4.6–6.5)

## 2015-06-23 LAB — TSH: TSH: 1.06 u[IU]/mL (ref 0.35–4.50)

## 2015-06-23 MED ORDER — FISH OIL 1200 MG PO CAPS: ORAL_CAPSULE | ORAL | Status: AC

## 2015-06-23 MED ORDER — CALCIUM CARBONATE-VITAMIN D 600-400 MG-UNIT PO CHEW: 1.0000 | CHEWABLE_TABLET | Freq: Every day | ORAL | Status: AC

## 2015-06-23 NOTE — Addendum Note (Signed)
Addended by: Terence Lux B on: 06/23/2015 10:29 AM   Modules accepted: Orders

## 2015-06-23 NOTE — Progress Notes (Signed)
Pre visit review using our clinic review tool, if applicable. No additional management support is needed unless otherwise documented below in the visit note. 

## 2015-06-23 NOTE — Patient Instructions (Addendum)
[facility]   -  Dr Zoe Lan [facility] [place], [place] [address] from [facility] [phone]    Test(s) ordered today. Your results will be released to [place] (or called to you) after review, usually within 72hours after test completion. If any changes need to be made, you will be notified at that same time.  All other Health Maintenance issues reviewed.   All recommended immunizations and age-appropriate screenings are up-to-date or discussed.  Pneumonia vaccine administered today.   Medications reviewed and updated.  No changes recommended at this time.   Health Maintenance, Female Adopting a healthy lifestyle and getting preventive care can go a long way to promote health and wellness. Talk with your health care provider about what schedule of regular examinations is right for you. This is a good chance for you to check in with your provider about disease prevention and staying healthy. In between checkups, there are plenty of things you can do on your own. Experts have done a lot of research about which lifestyle changes and preventive measures are most likely to keep you healthy. Ask your health care provider for more information. WEIGHT AND DIET  Eat a healthy diet  Be sure to include plenty of vegetables, fruits, low-fat dairy products, and lean protein.  Do not eat a lot of foods high in solid fats, added sugars, or salt.  Get regular exercise. This is one of the most important things you can do for your health.  Most adults should exercise for at least 150 minutes each week. The exercise should increase your heart rate and make you sweat (moderate-intensity exercise).  Most adults should also do strengthening exercises at least twice a week. This is in addition to the moderate-intensity exercise.  Maintain a healthy weight  Body mass index (BMI) is a measurement  that can be used to identify possible weight problems. It estimates body fat based on height and weight. Your health care provider can help determine your BMI and help you achieve or maintain a healthy weight.  For females 24 years of age and older:   A BMI below 18.5 is considered underweight.  A BMI of 18.5 to 24.9 is normal.  A BMI of 25 to 29.9 is considered overweight.  A BMI of 30 and above is considered obese.  Watch levels of cholesterol and blood lipids  You should start having your blood tested for lipids and cholesterol at 50 years of age, then have this test every 5 years.  You may need to have your cholesterol levels checked more often if:  Your lipid or cholesterol levels are high.  You are older than 50 years of age.  You are at high risk for heart disease.  CANCER SCREENING   Lung Cancer  Lung cancer screening is recommended for adults 41-16 years old who are at high risk for lung cancer because of a history of smoking.  A yearly low-dose CT scan of the lungs is recommended for people who:  Currently smoke.  Have quit within the past 15 years.  Have at least a 30-pack-year history of smoking. A pack year is smoking an average of one pack of cigarettes a day for 1 year.  Yearly screening should continue until it has been 15 years since you quit.  Yearly screening should stop if you develop a health problem that would prevent you from having lung cancer treatment.  Breast Cancer  Practice breast self-awareness. This means understanding how your breasts normally appear and feel.  It also means doing regular breast self-exams. Let your health care provider know about any changes, no matter how small.  If you are in your 20s or 30s, you should have a clinical breast exam (CBE) by a health care provider every 1-3 years as part of a regular health exam.  If you are 78 or older, have a CBE every year. Also consider having a breast X-ray (mammogram) every  year.  If you have a family history of breast cancer, talk to your health care provider about genetic screening.  If you are at high risk for breast cancer, talk to your health care provider about having an MRI and a mammogram every year.  Breast cancer gene (BRCA) assessment is recommended for women who have family members with BRCA-related cancers. BRCA-related cancers include:  Breast.  Ovarian.  Tubal.  Peritoneal cancers.  Results of the assessment will determine the need for genetic counseling and BRCA1 and BRCA2 testing. Cervical Cancer Your health care provider may recommend that you be screened regularly for cancer of the pelvic organs (ovaries, uterus, and vagina). This screening involves a pelvic examination, including checking for microscopic changes to the surface of your cervix (Pap test). You may be encouraged to have this screening done every 3 years, beginning at age 20.  For women ages 3-65, health care providers may recommend pelvic exams and Pap testing every 3 years, or they may recommend the Pap and pelvic exam, combined with testing for human papilloma virus (HPV), every 5 years. Some types of HPV increase your risk of cervical cancer. Testing for HPV may also be done on women of any age with unclear Pap test results.  Other health care providers may not recommend any screening for nonpregnant women who are considered low risk for pelvic cancer and who do not have symptoms. Ask your health care provider if a screening pelvic exam is right for you.  If you have had past treatment for cervical cancer or a condition that could lead to cancer, you need Pap tests and screening for cancer for at least 20 years after your treatment. If Pap tests have been discontinued, your risk factors (such as having a new sexual partner) need to be reassessed to determine if screening should resume. Some women have medical problems that increase the chance of getting cervical cancer. In  these cases, your health care provider may recommend more frequent screening and Pap tests. Colorectal Cancer  This type of cancer can be detected and often prevented.  Routine colorectal cancer screening usually begins at 50 years of age and continues through 50 years of age.  Your health care provider may recommend screening at an earlier age if you have risk factors for colon cancer.  Your health care provider may also recommend using home test kits to check for hidden blood in the stool.  A small camera at the end of a tube can be used to examine your colon directly (sigmoidoscopy or colonoscopy). This is done to check for the earliest forms of colorectal cancer.  Routine screening usually begins at age 21.  Direct examination of the colon should be repeated every 5-10 years through 50 years of age. However, you may need to be screened more often if early forms of precancerous polyps or small growths are found. Skin Cancer  Check your skin from head to toe regularly.  Tell your health care provider about any  new moles or changes in moles, especially if there is a change in a mole's shape or color.  Also tell your health care provider if you have a mole that is larger than the size of a pencil eraser.  Always use sunscreen. Apply sunscreen liberally and repeatedly throughout the day.  Protect yourself by wearing long sleeves, pants, a wide-brimmed hat, and sunglasses whenever you are outside. HEART DISEASE, DIABETES, AND HIGH BLOOD PRESSURE   High blood pressure causes heart disease and increases the risk of stroke. High blood pressure is more likely to develop in:  People who have blood pressure in the high end of the normal range (130-139/85-89 mm Hg).  People who are overweight or obese.  People who are African American.  If you are 12-54 years of age, have your blood pressure checked every 3-5 years. If you are 83 years of age or older, have your blood pressure checked  every year. You should have your blood pressure measured twice--once when you are at a hospital or clinic, and once when you are not at a hospital or clinic. Record the average of the two measurements. To check your blood pressure when you are not at a hospital or clinic, you can use:  An automated blood pressure machine at a pharmacy.  A home blood pressure monitor.  If you are between 27 years and 25 years old, ask your health care provider if you should take aspirin to prevent strokes.  Have regular diabetes screenings. This involves taking a blood sample to check your fasting blood sugar level.  If you are at a normal weight and have a low risk for diabetes, have this test once every three years after 50 years of age.  If you are overweight and have a high risk for diabetes, consider being tested at a younger age or more often. PREVENTING INFECTION  Hepatitis B  If you have a higher risk for hepatitis B, you should be screened for this virus. You are considered at high risk for hepatitis B if:  You were born in a country where hepatitis B is common. Ask your health care provider which countries are considered high risk.  Your parents were born in a high-risk country, and you have not been immunized against hepatitis B (hepatitis B vaccine).  You have HIV or AIDS.  You use needles to inject street drugs.  You live with someone who has hepatitis B.  You have had sex with someone who has hepatitis B.  You get hemodialysis treatment.  You take certain medicines for conditions, including cancer, organ transplantation, and autoimmune conditions. Hepatitis C  Blood testing is recommended for:  Everyone born from [birth year] through 1965.  Anyone with known risk factors for hepatitis C. Sexually transmitted infections (STIs)  You should be screened for sexually transmitted infections (STIs) including gonorrhea and chlamydia if:  You are sexually active and are younger than 50 years  of age.  You are older than 50 years of age and your health care provider tells you that you are at risk for this type of infection.  Your sexual activity has changed since you were last screened and you are at an increased risk for chlamydia or gonorrhea. Ask your health care provider if you are at risk.  If you do not have HIV, but are at risk, it may be recommended that you take a prescription medicine daily to prevent HIV infection. This is called pre-exposure prophylaxis (PrEP). You are considered at risk  if:  You are sexually active and do not regularly use condoms or know the HIV status of your partner(s).  You take drugs by injection.  You are sexually active with a partner who has HIV. Talk with your health care provider about whether you are at high risk of being infected with HIV. If you choose to begin PrEP, you should first be tested for HIV. You should then be tested every 3 months for as long as you are taking PrEP.  PREGNANCY   If you are premenopausal and you may become pregnant, ask your health care provider about preconception counseling.  If you may become pregnant, take 400 to 800 micrograms (mcg) of folic acid every day.  If you want to prevent pregnancy, talk to your health care provider about birth control (contraception). OSTEOPOROSIS AND MENOPAUSE   Osteoporosis is a disease in which the bones lose minerals and strength with aging. This can result in serious bone fractures. Your risk for osteoporosis can be identified using a bone density scan.  If you are 65 years of age or older, or if you are at risk for osteoporosis and fractures, ask your health care provider if you should be screened.  Ask your health care provider whether you should take a calcium or vitamin D supplement to lower your risk for osteoporosis.  Menopause may have certain physical symptoms and risks.  Hormone replacement therapy may reduce some of these symptoms and risks. Talk to your  health care provider about whether hormone replacement therapy is right for you.  HOME CARE INSTRUCTIONS   Schedule regular health, dental, and eye exams.  Stay current with your immunizations.   Do not use any tobacco products including cigarettes, chewing tobacco, or electronic cigarettes.  If you are pregnant, do not drink alcohol.  If you are breastfeeding, limit how much and how often you drink alcohol.  Limit alcohol intake to no more than 1 drink per day for nonpregnant women. One drink equals 12 ounces of beer, 5 ounces of wine, or 1 ounces of hard liquor.  Do not use street drugs.  Do not share needles.  Ask your health care provider for help if you need support or information about quitting drugs.  Tell your health care provider if you often feel depressed.  Tell your health care provider if you have ever been abused or do not feel safe at home.   This information is not intended to replace advice given to you by your health care provider. Make sure you discuss any questions you have with your health care provider.   Document Released: 08/13/2010 Document Revised: 02/18/2014 Document Reviewed: 12/30/2012 Elsevier Interactive Patient Education 2016 [employer]  

## 2015-06-23 NOTE — Progress Notes (Signed)
Subjective:    Patient ID: Ashley Mccann, female    DOB: 08-19-1965, 50 y.o.   MRN: [id number]  HPI She is here to establish with a new pcp.  She is here for a physical exam.   She denies denies concerns or questions.  She denies changes in her health since she was here last.   She did not know she was a diabetic.  She eats healthy.  She walks daily for about 15 minutes.  She denies a family history of diabetes.    Medications and allergies reviewed with patient and updated if appropriate.  Patient Active Problem List   Diagnosis Date Noted  . Diabetes type 2, controlled ([place]) 07/26/2011  . Allergic rhinitis, cause unspecified 06/14/2010    Current Outpatient Prescriptions on File Prior to Visit  Medication Sig Dispense Refill  . ibuprofen (ADVIL,MOTRIN) 200 MG tablet Take 400 mg by mouth 3 (three) times daily.    . Multiple Vitamin (MULTIVITAMIN WITH MINERALS) TABS tablet Take 1 tablet by mouth daily.    . mupirocin ointment (BACTROBAN) 2 % Applied to the affected area after the Sitz baths (Patient taking differently: Apply 1 application topically daily. Applied to the affected area after the Sitz baths) 22 g 0  . ondansetron (ZOFRAN) 4 MG tablet Take 1 tablet (4 mg total) by mouth every 6 (six) hours. 12 tablet 0   No current facility-administered medications on file prior to visit.    History reviewed. No pertinent past medical history.  Past Surgical History  Procedure Laterality Date  . Cesarean section    . Tubal ligation      Social History   Social History  . Marital Status: Married    Spouse Name: N/A  . Number of Children: N/A  . Years of Education: N/A   Occupational History  . [employer] at [employer] Topics  . Smoking status: Never Smoker   . Smokeless tobacco: None  . Alcohol Use: No  . Drug Use: No  . Sexual Activity: Yes    Birth Control/ Protection: Surgical   Other Topics Concern  . None   Social History Narrative   Regular Exercise -  NO          Family History  Problem Relation Age of Onset  . Hypertension Other     Review of Systems  Constitutional: Negative for fever, chills, appetite change and fatigue.  HENT: Negative for hearing loss.   Eyes: Negative for visual disturbance.  Respiratory: Positive for cough (weather changes). Negative for shortness of breath and wheezing.   Cardiovascular: Negative for chest pain, palpitations and leg swelling.  Gastrointestinal: Negative for nausea, abdominal pain, diarrhea, constipation and blood in stool.       No gerd  Genitourinary: Negative for dysuria and hematuria.  Musculoskeletal: Negative for myalgias, back pain and arthralgias.  Skin: Negative for color change and rash.  Neurological: Positive for headaches (sometiems, associated with menses). Negative for dizziness, light-headedness and numbness.  Psychiatric/Behavioral: Negative for sleep disturbance and dysphoric mood. The patient is not nervous/anxious.        Objective:   Filed Vitals:   06/23/15 0831  BP: 114/78  Pulse: 74  Temp: 98.7 F (37.1 C)  Resp: 16   Filed Weights   06/23/15 0831  Weight: 142 lb (64.411 kg)   Body mass index is 27.73 kg/(m^2).   Physical Exam Constitutional: She appears well-developed and well-nourished. No distress.  HENT:  Head: Normocephalic and atraumatic.  Right Ear: External ear normal. Normal ear canal and TM Left Ear: External ear normal.  Normal ear canal and TM Mouth/Throat: Oropharynx is clear and moist.  Eyes: Conjunctivae and EOM are normal.  Neck: Neck supple. No tracheal deviation present. No thyromegaly present.  No carotid bruit  Cardiovascular: Normal rate, regular rhythm and normal heart sounds.   No murmur heard.  No edema. Pulmonary/Chest: Effort normal and breath sounds normal. No respiratory distress. She has no wheezes. She has no rales.  Breast: deferred to Gyn Abdominal: Soft. She exhibits no distension. There is  no tenderness.  Lymphadenopathy: She has no cervical adenopathy.  Skin: Skin is warm and dry. She is not diaphoretic.  Psychiatric: She has a normal mood and affect. Her behavior is normal.       Assessment & Plan:   Physical exam: Screening blood work ordered Immunizations Up to date  Mammogram  Up to date  Gyn - will establish, given info for Dr Melba Coon Eye exams - not up to date Exercise - walking daily - only 15 minutes Weight - advised weight loss Skin - no concerns Substance abuse - none   See Problem List for Assessment and Plan of chronic medical problems.  Follow up depending on blood work

## 2015-06-26 ENCOUNTER — Other Ambulatory Visit: Payer: Self-pay | Admitting: Emergency Medicine

## 2015-06-26 NOTE — Telephone Encounter (Signed)
Error

## 2015-06-28 ENCOUNTER — Telehealth: Payer: Self-pay

## 2015-06-28 NOTE — Telephone Encounter (Signed)
Please advise on referral

## 2015-06-28 NOTE — Telephone Encounter (Signed)
Patient called Back about labs. I informed them of the notes and they said that all sounds fine. Will call back to make a fu in 63months. Patient also wanted a referral to a GYN. Please follow up.

## 2015-06-28 NOTE — Telephone Encounter (Signed)
I left a message on vm informing patient that she does not need a referral for gynecology. She can call and schedule with a facility of her choice.

## 2015-12-11 ENCOUNTER — Other Ambulatory Visit: Payer: Self-pay | Admitting: Internal Medicine

## 2015-12-11 DIAGNOSIS — Z1231 Encounter for screening mammogram for malignant neoplasm of breast: Secondary | ICD-10-CM

## 2015-12-29 ENCOUNTER — Ambulatory Visit: Payer: BLUE CROSS/BLUE SHIELD

## 2016-01-03 ENCOUNTER — Ambulatory Visit
Admission: RE | Admit: 2016-01-03 | Discharge: 2016-01-03 | Disposition: A | Discharge status: Home / Self Care | Payer: BLUE CROSS/BLUE SHIELD | Source: Ambulatory Visit | Attending: Internal Medicine | Admitting: Internal Medicine

## 2016-01-03 DIAGNOSIS — Z1231 Encounter for screening mammogram for malignant neoplasm of breast: Secondary | ICD-10-CM

## 2016-05-07 IMAGING — CT CT PELVIS W/ CM
1 series · 16 of 32 positions shown, 20 images · IV contrast (omnipaque)
Comparison: None.

CLINICAL DATA: Left-sided peroneal/buttock pain and swelling.

EXAM:
CT PELVIS WITH CONTRAST
TECHNIQUE: Multidetector CT imaging of the pelvis was performed using the
standard protocol following the bolus administration of intravenous
contrast.
CONTRAST:  100mL OMNIPAQUE IOHEXOL 300 MG/ML  SOLN

[Series 2: pelvis with · axial · 0.76mm/px · z∈[+996,+1221]mm · 16 of 51 slices shown, 20 images]
[im 4/51  soft-tissue]
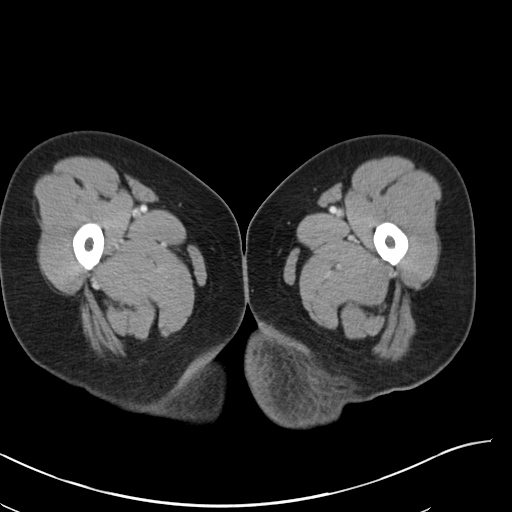
[im 4/51  bone]
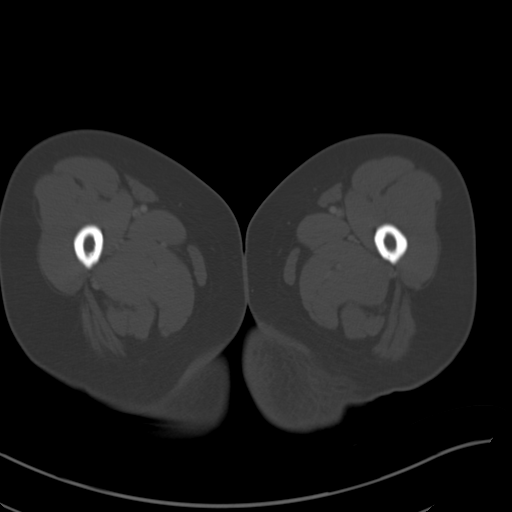
[im 7/51  soft-tissue]
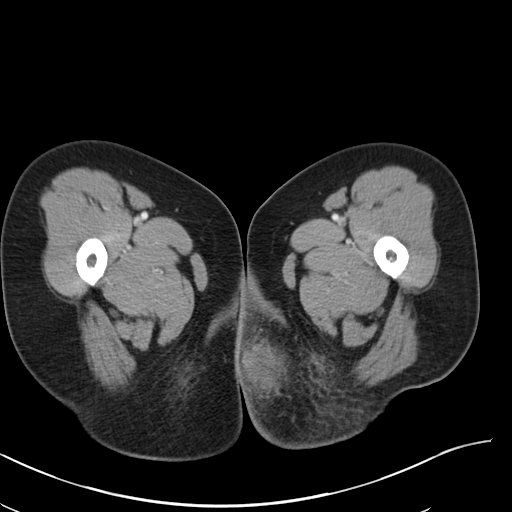
[im 10/51  soft-tissue]
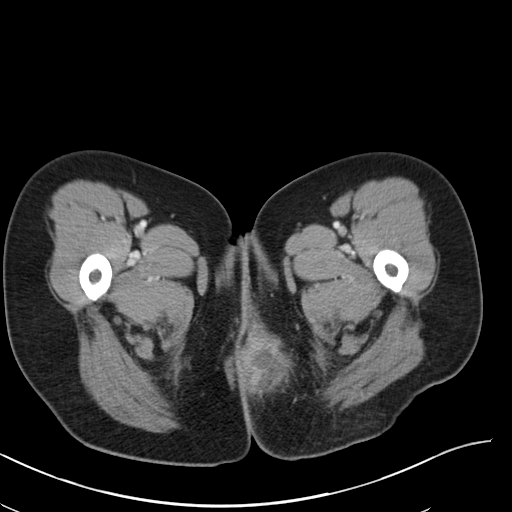
[im 13/51  soft-tissue]
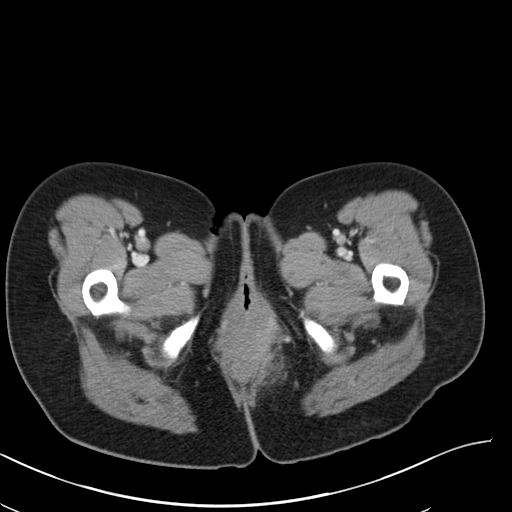
[im 17/51  soft-tissue]
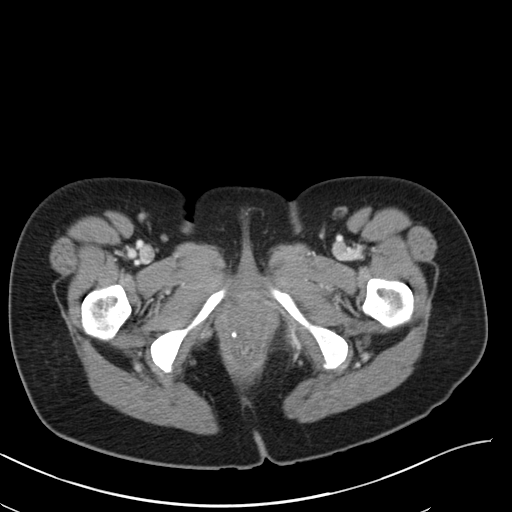
[im 20/51  soft-tissue]
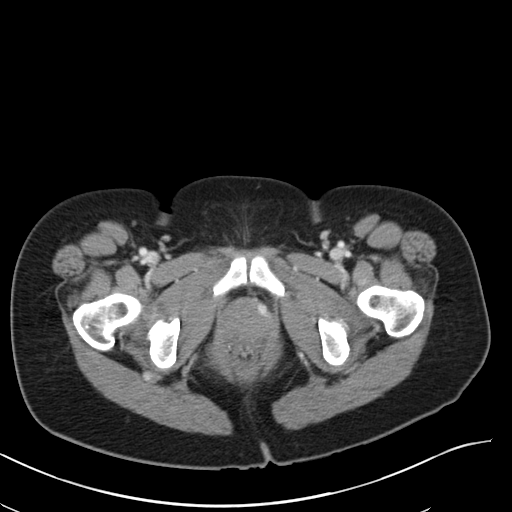
[im 23/51  soft-tissue]
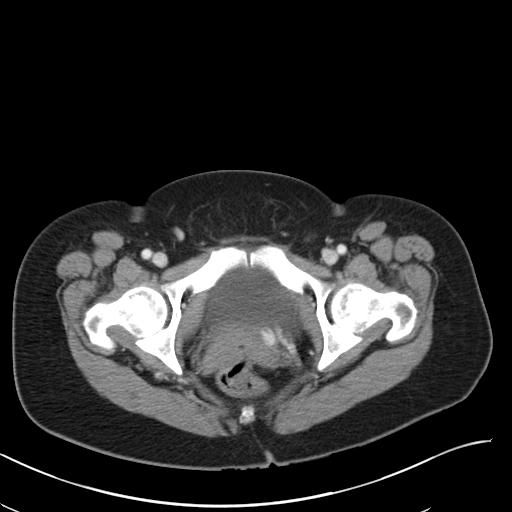
[im 28/51  soft-tissue]
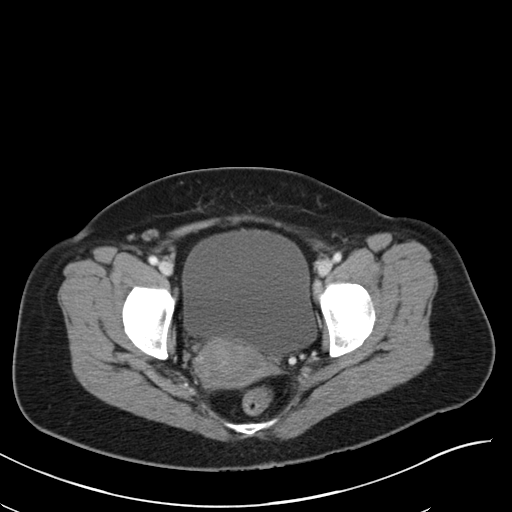
[im 31/51  soft-tissue]
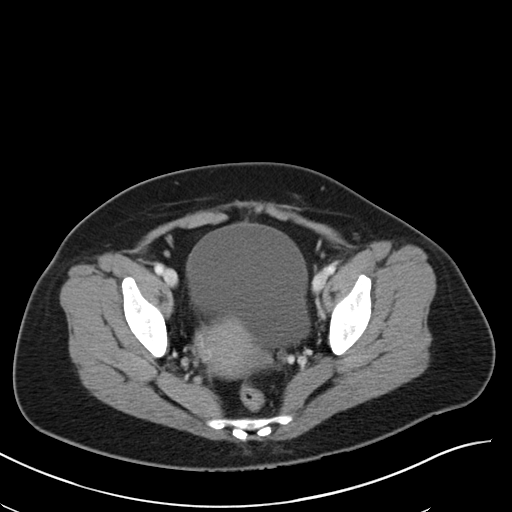
[im 31/51  bone]
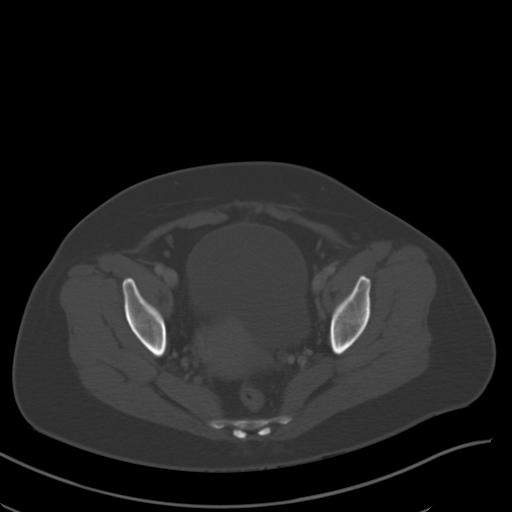
[im 34/51  soft-tissue]
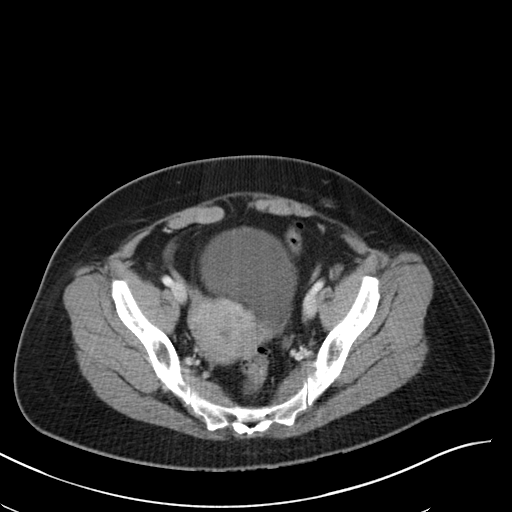
[im 38/51  soft-tissue]
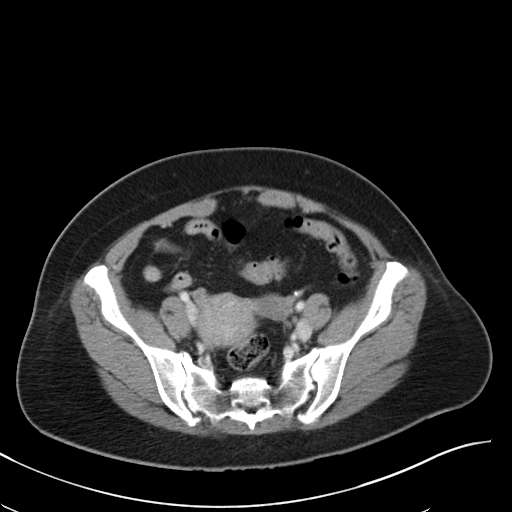
[im 41/51  soft-tissue]
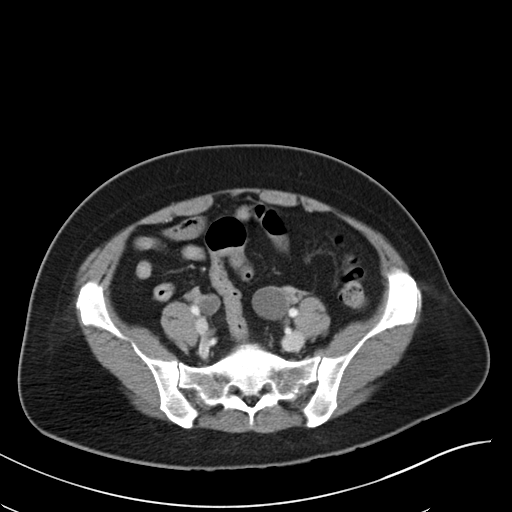
[im 44/51  soft-tissue]
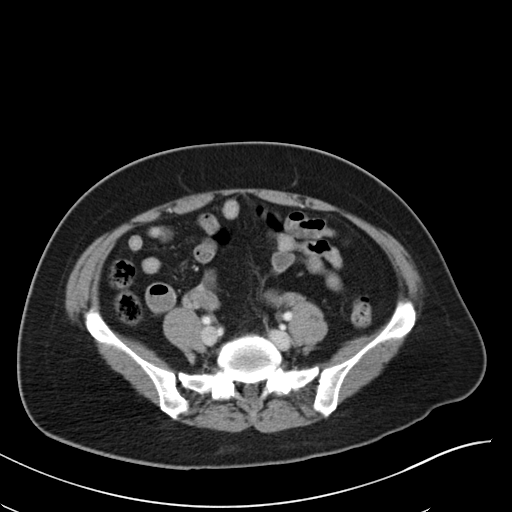
[im 44/51  lung]
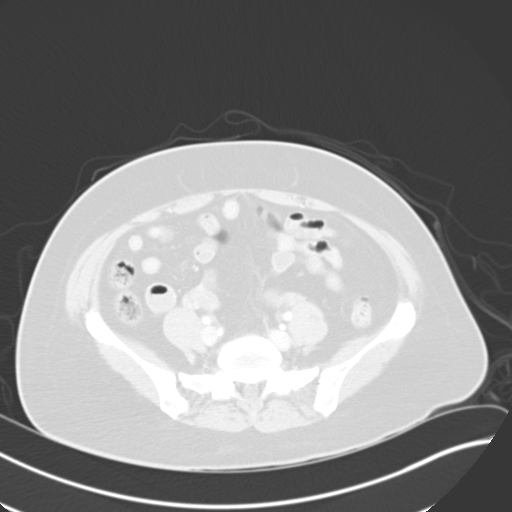
[im 46/51  lung]
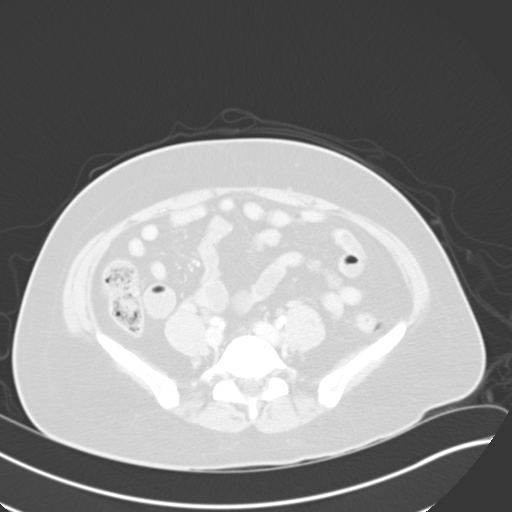
[im 47/51  soft-tissue]
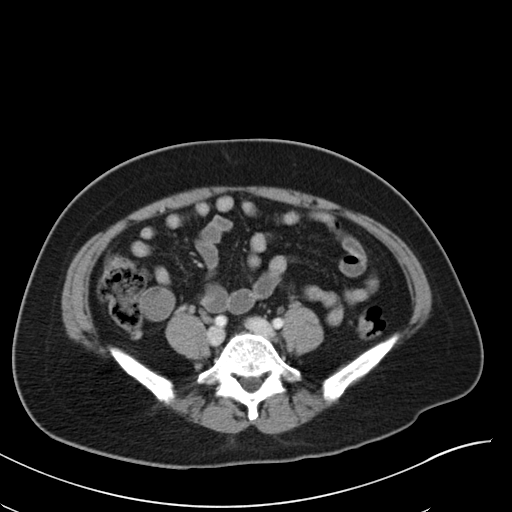
[im 47/51  lung]
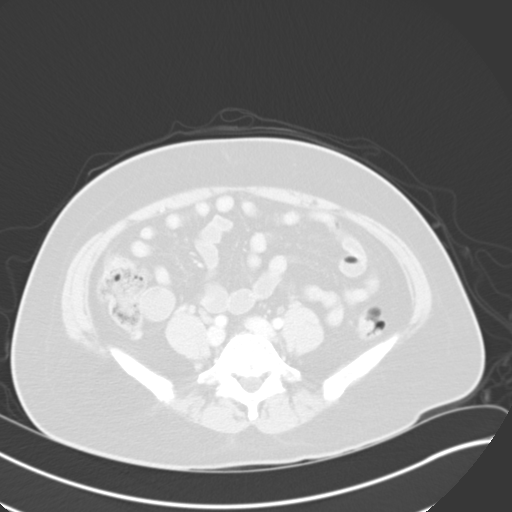
[im 49/51  lung]
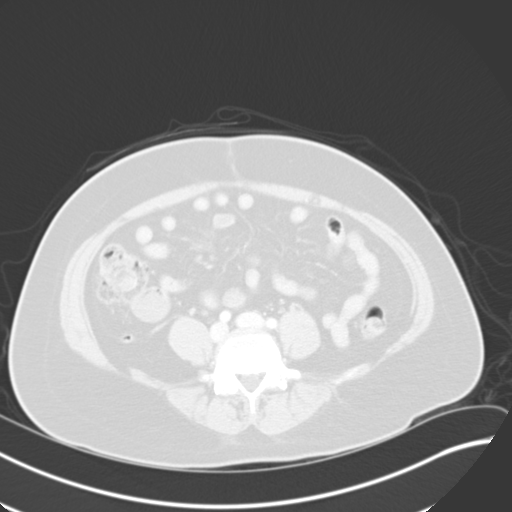

[16 of 32 positions shown; findings below may reference images not displayed]

FINDINGS: There is a 3.3 cm subcutaneous abscess in the left upper buttock
area/perineum. This could be related to a perianal fistula.
Significant surrounding inflammation/ enhancement/cellulitis. No
perirectal abscess or intrapelvic abscess.

The uterus and ovaries are unremarkable. There is a simple appearing
left ovarian cyst. The bladder is normal. The appendix is normal. No
pelvic mass, adenopathy or free pelvic fluid collections. No
inguinal mass or adenopathy.
IMPRESSION: 3.3 cm subcutaneous abscess in the left upper buttock/peroneal area.
This could be secondary to a perianal fistula.

No significant intrapelvic abnormalities.

## 2016-10-26 NOTE — Patient Instructions (Addendum)
[facility]  [place]  [place], [place] [address]  Main: [phone]    Test(s) ordered today. Your results will be released to [facility] (or called to you) after review, usually within 72hours after test completion. If any changes need to be made, you will be notified at that same time.  All other Health Maintenance issues reviewed.   All recommended immunizations and age-appropriate screenings are up-to-date or discussed.  Flu immunization administered today.   Medications reviewed and updated.  No changes recommended at this time.  Your prescription(s) have been submitted to your pharmacy. Please take as directed and contact our office if you believe you are having problem(s) with the medication(s).  A referral was ordered for GI for a colonoscopy - they will call you  Please followup in 6 months    Health Maintenance, Female Adopting a healthy lifestyle and getting preventive care can go a long way to promote health and wellness. Talk with your health care provider about what schedule of regular examinations is right for you. This is a good chance for you to check in with your provider about disease prevention and staying healthy. In between checkups, there are plenty of things you can do on your own. Experts have done a lot of research about which lifestyle changes and preventive measures are most likely to keep you healthy. Ask your health care provider for more information. Weight and diet Eat a healthy diet  Be sure to include plenty of vegetables, fruits, low-fat dairy products, and lean protein.  Do not eat a lot of foods high in solid fats, added sugars, or salt.  Get regular exercise. This is one of the most important things you can do for your health. ? Most adults should exercise for at least 150 minutes each week. The exercise should increase your heart rate and make you sweat (moderate-intensity exercise). ? Most adults should  also do strengthening exercises at least twice a week. This is in addition to the moderate-intensity exercise.  Maintain a healthy weight  Body mass index (BMI) is a measurement that can be used to identify possible weight problems. It estimates body fat based on height and weight. Your health care provider can help determine your BMI and help you achieve or maintain a healthy weight.  For females 29 years of age and older: ? A BMI below 18.5 is considered underweight. ? A BMI of 18.5 to 24.9 is normal. ? A BMI of 25 to 29.9 is considered overweight. ? A BMI of 30 and above is considered obese.  Watch levels of cholesterol and blood lipids  You should start having your blood tested for lipids and cholesterol at 51 years of age, then have this test every 5 years.  You may need to have your cholesterol levels checked more often if: ? Your lipid or cholesterol levels are high. ? You are older than 51 years of age. ? You are at high risk for heart disease.  Cancer screening Lung Cancer  Lung cancer screening is recommended for adults 39-68 years old who are at high risk for lung cancer because of a history of smoking.  A yearly low-dose CT scan of the lungs is recommended for people who: ? Currently smoke. ? Have quit within the past 15 years. ? Have at least a 30-pack-year history of smoking. A pack year is smoking an average of one pack of cigarettes a day for 1 year.  Yearly screening should  continue until it has been 15 years since you quit.  Yearly screening should stop if you develop a health problem that would prevent you from having lung cancer treatment.  Breast Cancer  Practice breast self-awareness. This means understanding how your breasts normally appear and feel.  It also means doing regular breast self-exams. Let your health care provider know about any changes, no matter how small.  If you are in your 20s or 30s, you should have a clinical breast exam (CBE) by a  health care provider every 1-3 years as part of a regular health exam.  If you are 58 or older, have a CBE every year. Also consider having a breast X-ray (mammogram) every year.  If you have a family history of breast cancer, talk to your health care provider about genetic screening.  If you are at high risk for breast cancer, talk to your health care provider about having an MRI and a mammogram every year.  Breast cancer gene (BRCA) assessment is recommended for women who have family members with BRCA-related cancers. BRCA-related cancers include: ? Breast. ? Ovarian. ? Tubal. ? Peritoneal cancers.  Results of the assessment will determine the need for genetic counseling and BRCA1 and BRCA2 testing.  Cervical Cancer Your health care provider may recommend that you be screened regularly for cancer of the pelvic organs (ovaries, uterus, and vagina). This screening involves a pelvic examination, including checking for microscopic changes to the surface of your cervix (Pap test). You may be encouraged to have this screening done every 3 years, beginning at age 65.  For women ages 48-65, health care providers may recommend pelvic exams and Pap testing every 3 years, or they may recommend the Pap and pelvic exam, combined with testing for human papilloma virus (HPV), every 5 years. Some types of HPV increase your risk of cervical cancer. Testing for HPV may also be done on women of any age with unclear Pap test results.  Other health care providers may not recommend any screening for nonpregnant women who are considered low risk for pelvic cancer and who do not have symptoms. Ask your health care provider if a screening pelvic exam is right for you.  If you have had past treatment for cervical cancer or a condition that could lead to cancer, you need Pap tests and screening for cancer for at least 20 years after your treatment. If Pap tests have been discontinued, your risk factors (such as having  a new sexual partner) need to be reassessed to determine if screening should resume. Some women have medical problems that increase the chance of getting cervical cancer. In these cases, your health care provider may recommend more frequent screening and Pap tests.  Colorectal Cancer  This type of cancer can be detected and often prevented.  Routine colorectal cancer screening usually begins at 51 years of age and continues through 51 years of age.  Your health care provider may recommend screening at an earlier age if you have risk factors for colon cancer.  Your health care provider may also recommend using home test kits to check for hidden blood in the stool.  A small camera at the end of a tube can be used to examine your colon directly (sigmoidoscopy or colonoscopy). This is done to check for the earliest forms of colorectal cancer.  Routine screening usually begins at age 33.  Direct examination of the colon should be repeated every 5-10 years through 51 years of age. However, you may  need to be screened more often if early forms of precancerous polyps or small growths are found.  Skin Cancer  Check your skin from head to toe regularly.  Tell your health care provider about any new moles or changes in moles, especially if there is a change in a mole's shape or color.  Also tell your health care provider if you have a mole that is larger than the size of a pencil eraser.  Always use sunscreen. Apply sunscreen liberally and repeatedly throughout the day.  Protect yourself by wearing long sleeves, pants, a wide-brimmed hat, and sunglasses whenever you are outside.  Heart disease, diabetes, and high blood pressure  High blood pressure causes heart disease and increases the risk of stroke. High blood pressure is more likely to develop in: ? People who have blood pressure in the high end of the normal range (130-139/85-89 mm Hg). ? People who are overweight or obese. ? People who  are African American.  If you are 9-53 years of age, have your blood pressure checked every 3-5 years. If you are 39 years of age or older, have your blood pressure checked every year. You should have your blood pressure measured twice-once when you are at a hospital or clinic, and once when you are not at a hospital or clinic. Record the average of the two measurements. To check your blood pressure when you are not at a hospital or clinic, you can use: ? An automated blood pressure machine at a pharmacy. ? A home blood pressure monitor.  If you are between 49 years and 76 years old, ask your health care provider if you should take aspirin to prevent strokes.  Have regular diabetes screenings. This involves taking a blood sample to check your fasting blood sugar level. ? If you are at a normal weight and have a low risk for diabetes, have this test once every three years after 51 years of age. ? If you are overweight and have a high risk for diabetes, consider being tested at a younger age or more often. Preventing infection Hepatitis B  If you have a higher risk for hepatitis B, you should be screened for this virus. You are considered at high risk for hepatitis B if: ? You were born in a country where hepatitis B is common. Ask your health care provider which countries are considered high risk. ? Your parents were born in a high-risk country, and you have not been immunized against hepatitis B (hepatitis B vaccine). ? You have HIV or AIDS. ? You use needles to inject street drugs. ? You live with someone who has hepatitis B. ? You have had sex with someone who has hepatitis B. ? You get hemodialysis treatment. ? You take certain medicines for conditions, including cancer, organ transplantation, and autoimmune conditions.  Hepatitis C  Blood testing is recommended for: ? Everyone born from [birth year] through 1965. ? Anyone with known risk factors for hepatitis C.  Sexually transmitted  infections (STIs)  You should be screened for sexually transmitted infections (STIs) including gonorrhea and chlamydia if: ? You are sexually active and are younger than 51 years of age. ? You are older than 51 years of age and your health care provider tells you that you are at risk for this type of infection. ? Your sexual activity has changed since you were last screened and you are at an increased risk for chlamydia or gonorrhea. Ask your health care provider if you are at  risk.  If you do not have HIV, but are at risk, it may be recommended that you take a prescription medicine daily to prevent HIV infection. This is called pre-exposure prophylaxis (PrEP). You are considered at risk if: ? You are sexually active and do not regularly use condoms or know the HIV status of your partner(s). ? You take drugs by injection. ? You are sexually active with a partner who has HIV.  Talk with your health care provider about whether you are at high risk of being infected with HIV. If you choose to begin PrEP, you should first be tested for HIV. You should then be tested every 3 months for as long as you are taking PrEP. Pregnancy  If you are premenopausal and you may become pregnant, ask your health care provider about preconception counseling.  If you may become pregnant, take 400 to 800 micrograms (mcg) of folic acid every day.  If you want to prevent pregnancy, talk to your health care provider about birth control (contraception). Osteoporosis and menopause  Osteoporosis is a disease in which the bones lose minerals and strength with aging. This can result in serious bone fractures. Your risk for osteoporosis can be identified using a bone density scan.  If you are 39 years of age or older, or if you are at risk for osteoporosis and fractures, ask your health care provider if you should be screened.  Ask your health care provider whether you should take a calcium or vitamin D supplement to lower  your risk for osteoporosis.  Menopause may have certain physical symptoms and risks.  Hormone replacement therapy may reduce some of these symptoms and risks. Talk to your health care provider about whether hormone replacement therapy is right for you. Follow these instructions at home:  Schedule regular health, dental, and eye exams.  Stay current with your immunizations.  Do not use any tobacco products including cigarettes, chewing tobacco, or electronic cigarettes.  If you are pregnant, do not drink alcohol.  If you are breastfeeding, limit how much and how often you drink alcohol.  Limit alcohol intake to no more than 1 drink per day for nonpregnant women. One drink equals 12 ounces of beer, 5 ounces of wine, or 1 ounces of hard liquor.  Do not use street drugs.  Do not share needles.  Ask your health care provider for help if you need support or information about quitting drugs.  Tell your health care provider if you often feel depressed.  Tell your health care provider if you have ever been abused or do not feel safe at home. This information is not intended to replace advice given to you by your health care provider. Make sure you discuss any questions you have with your health care provider. Document Released: 08/13/2010 Document Revised: 07/06/2015 Document Reviewed: 11/01/2014 Elsevier Interactive Patient Education  [employer].

## 2016-10-26 NOTE — Progress Notes (Signed)
Subjective:    Patient ID: Ashley Mccann, female    DOB: [birth year], 51 y.o.   MRN: [id number]  HPI She is here for a physical exam.   Headaches:  She gets headaches about three times a week.  It is in the top of the head.  She takes motrin and it is effective.  It is mild and she thinks it occurs only when she washes her hair.    Medications and allergies reviewed with patient and updated if appropriate.  Patient Active Problem List   Diagnosis Date Noted  . Diabetes type 2, controlled ([place]) 07/26/2011  . Allergic rhinitis, cause unspecified 06/14/2010    Current Outpatient Prescriptions on File Prior to Visit  Medication Sig Dispense Refill  . Calcium Carbonate-Vitamin D (CALCIUM 600/VITAMIN D) 600-400 MG-UNIT chew tablet Chew 1 tablet by mouth daily.    Marland Kitchen ibuprofen (ADVIL,MOTRIN) 200 MG tablet Take 400 mg by mouth 3 (three) times daily.    . Multiple Vitamin (MULTIVITAMIN WITH MINERALS) TABS tablet Take 1 tablet by mouth daily.    . Omega-3 Fatty Acids (FISH OIL) 1200 MG CAPS Taking daily     No current facility-administered medications on file prior to visit.     History reviewed. No pertinent past medical history.  Past Surgical History:  Procedure Laterality Date  . CESAREAN SECTION    . TUBAL LIGATION      Social History   Social History  . Marital status: Married    Spouse name: N/A  . Number of children: N/A  . Years of education: N/A   Occupational History  . S[employer] at [employer]  . Smoking status: Never Smoker  . Smokeless tobacco: Never Used  . Alcohol use No  . Drug use: No  . Sexual activity: Yes    Birth control/ protection: Surgical   Other Topics Concern  . None   Social History Narrative   Regular Exercise -  Some walking          Family History  Problem Relation Age of Onset  . Hypertension Other     Review of Systems  Constitutional: Negative for chills and fever.  Eyes: Negative for visual  disturbance.  Respiratory: Negative for cough, shortness of breath and wheezing.   Cardiovascular: Negative for chest pain, palpitations and leg swelling.  Gastrointestinal: Negative for abdominal pain, blood in stool, constipation, diarrhea and nausea.  Genitourinary: Negative for dysuria and hematuria.  Musculoskeletal: Negative for arthralgias and back pain (intermittent).  Skin: Negative for color change and rash.  Neurological: Positive for headaches. Negative for dizziness and light-headedness.  Psychiatric/Behavioral: Negative for dysphoric mood. The patient is not nervous/anxious.        Objective:   Vitals:   10/28/16 0807  BP: 120/82  Pulse: 77  Resp: 16  Temp: 97.9 F (36.6 C)  SpO2: 98%   Filed Weights   10/28/16 0807  Weight: 141 lb (64 kg)   Body mass index is 27.54 kg/m.  Wt Readings from Last 3 Encounters:  10/28/16 141 lb (64 kg)  06/23/15 142 lb (64.4 kg)  02/09/14 138 lb 12 oz (62.9 kg)     Physical Exam Constitutional: She appears well-developed and well-nourished. No distress.  HENT:  Head: Normocephalic and atraumatic.  Right Ear: External ear normal. Normal ear canal and TM Left Ear: External ear normal.  Normal ear canal and TM Mouth/Throat: Oropharynx is clear and moist.  Eyes: Conjunctivae and  EOM are normal.  Neck: Neck supple. No tracheal deviation present. No thyromegaly present.  No carotid bruit  Cardiovascular: Normal rate, regular rhythm and normal heart sounds.   No murmur heard.  No edema. Pulmonary/Chest: Effort normal and breath sounds normal. No respiratory distress. She has no wheezes. She has no rales.  Breast: deferred to Gyn - will establish Abdominal: Soft. She exhibits no distension. There is no tenderness.  Lymphadenopathy: She has no cervical adenopathy.  Skin: Skin is warm and dry. She is not diaphoretic.  Psychiatric: She has a normal mood and affect. Her behavior is normal.       Assessment & Plan:   Physical  exam: Screening blood work  ordered Immunizations  Flu vaccine today Colonoscopy   Never had one - will refer Mammogram    Up to date  Gyn   - not up to date - will give information to establish Eye exams  Up to date - advised to let her eye doctor know she has diabetes EKG   Last EKG 2011 Exercise  Walking regularly Weight  Advised weight loss Skin  No concerns Substance abuse   none  See Problem List for Assessment and Plan of chronic medical problems.  FU in 6 months

## 2016-10-26 NOTE — Assessment & Plan Note (Addendum)
Diet controlled Last a1c one year ago 6.9% Check a1c, urine micro Low sugar / carb diet Stressed regular exercise  Advised yearly eye exams Follow up in 6 months

## 2016-10-28 ENCOUNTER — Ambulatory Visit (INDEPENDENT_AMBULATORY_CARE_PROVIDER_SITE_OTHER): Payer: BLUE CROSS/BLUE SHIELD | Admitting: Internal Medicine

## 2016-10-28 ENCOUNTER — Encounter: Payer: Self-pay | Admitting: Internal Medicine

## 2016-10-28 ENCOUNTER — Encounter: Payer: Self-pay | Admitting: Gastroenterology

## 2016-10-28 ENCOUNTER — Other Ambulatory Visit (INDEPENDENT_AMBULATORY_CARE_PROVIDER_SITE_OTHER): Payer: BLUE CROSS/BLUE SHIELD

## 2016-10-28 VITALS — BP 120/82 | HR 77 | Temp 97.9°F | Resp 16 | Ht 60.0 in | Wt 141.0 lb

## 2016-10-28 DIAGNOSIS — E119 Type 2 diabetes mellitus without complications: Secondary | ICD-10-CM | POA: Diagnosis not present

## 2016-10-28 DIAGNOSIS — Z0000 Encounter for general adult medical examination without abnormal findings: Secondary | ICD-10-CM | POA: Diagnosis not present

## 2016-10-28 DIAGNOSIS — Z1211 Encounter for screening for malignant neoplasm of colon: Secondary | ICD-10-CM | POA: Diagnosis not present

## 2016-10-28 LAB — COMPREHENSIVE METABOLIC PANEL
ALBUMIN: 4 g/dL (ref 3.5–5.2)
ALT: 18 U/L (ref 0–35)
AST: 16 U/L (ref 0–37)
Alkaline Phosphatase: 48 U/L (ref 39–117)
BILIRUBIN TOTAL: 0.4 mg/dL (ref 0.2–1.2)
BUN: 16 mg/dL (ref 6–23)
CALCIUM: 9.1 mg/dL (ref 8.4–10.5)
CHLORIDE: 102 meq/L (ref 96–112)
CO2: 26 meq/L (ref 19–32)
Creatinine, Ser: 0.66 mg/dL (ref 0.40–1.20)
GFR: 100.3 mL/min (ref >60.00)
Glucose, Bld: 114 mg/dL — ABNORMAL HIGH (ref 70–99)
Potassium: 4.2 mEq/L (ref 3.5–5.1)
Sodium: 136 mEq/L (ref 135–145)
Total Protein: 7.1 g/dL (ref 6.0–8.3)

## 2016-10-28 LAB — CBC WITH DIFFERENTIAL/PLATELET
BASOS PCT: 0.4 % (ref 0.0–3.0)
Basophils Absolute: 0 10*3/uL (ref 0.0–0.1)
Eosinophils Absolute: 0.1 10*3/uL (ref 0.0–0.7)
Eosinophils Relative: 0.7 % (ref 0.0–5.0)
HEMATOCRIT: 39.2 % (ref 36.0–46.0)
Hemoglobin: 13.3 g/dL (ref 12.0–15.0)
Lymphocytes Relative: 33.8 % (ref 12.0–46.0)
Lymphs Abs: 2.4 10*3/uL (ref 0.7–4.0)
MCHC: 33.8 g/dL (ref 30.0–36.0)
MCV: 89.1 fl (ref 78.0–100.0)
Monocytes Absolute: 0.4 10*3/uL (ref 0.1–1.0)
Monocytes Relative: 5.7 % (ref 3.0–12.0)
NEUTROS ABS: 4.3 10*3/uL (ref 1.4–7.7)
Neutrophils Relative %: 59.4 % (ref 43.0–77.0)
PLATELETS: 298 10*3/uL (ref 150.0–400.0)
RBC: 4.4 Mil/uL (ref 3.87–5.11)
RDW: 12.6 % (ref 11.5–15.5)
WBC: 7.2 10*3/uL (ref 4.0–10.5)

## 2016-10-28 LAB — HEMOGLOBIN A1C: HEMOGLOBIN A1C: 6.6 % — AB (ref 4.6–6.5)

## 2016-10-28 LAB — LIPID PANEL
CHOLESTEROL: 172 mg/dL (ref 0–200)
HDL: 74.1 mg/dL (ref >39.00)
LDL CALC: 81 mg/dL (ref 0–99)
NonHDL: 97.87
TRIGLYCERIDES: 86 mg/dL (ref 0.0–149.0)
Total CHOL/HDL Ratio: 2
VLDL: 17.2 mg/dL (ref 0.0–40.0)

## 2016-10-28 LAB — MICROALBUMIN / CREATININE URINE RATIO
Creatinine,U: 50.8 mg/dL
Microalb Creat Ratio: 1.4 mg/g (ref 0.0–30.0)
Microalb, Ur: <0.7 mg/dL (ref 0.0–1.9)

## 2016-10-28 LAB — TSH: TSH: 1.26 u[IU]/mL (ref 0.35–4.50)

## 2016-12-16 ENCOUNTER — Ambulatory Visit (AMBULATORY_SURGERY_CENTER): Payer: Self-pay | Admitting: *Deleted

## 2016-12-16 VITALS — Ht 60.0 in | Wt 139.8 lb

## 2016-12-16 DIAGNOSIS — Z1211 Encounter for screening for malignant neoplasm of colon: Secondary | ICD-10-CM

## 2016-12-16 MED ORDER — NA SULFATE-K SULFATE-MG SULF 17.5-3.13-1.6 GM/177ML PO SOLN: 1.0000 [IU] | Freq: Once | ORAL | 0 refills | Status: AC

## 2016-12-16 NOTE — Progress Notes (Signed)
No egg or soy allergy known to patient  No issues with past sedation with any surgeries  or procedures, no intubation problems  No diet pills per patient No home 02 use per patient  No blood thinners per patient  Pt denies issues with constipation  No A fib or A flutter  EMMI video sent to pt's e mail  

## 2016-12-18 ENCOUNTER — Encounter: Payer: Self-pay | Admitting: Gastroenterology

## 2016-12-26 ENCOUNTER — Telehealth: Payer: Self-pay | Admitting: Gastroenterology

## 2016-12-26 NOTE — Telephone Encounter (Signed)
[name] for pt to come to [facility] 4th floor and pick up Suprep Sample (free to pt). Angela/PV

## 2016-12-30 ENCOUNTER — Encounter: Payer: Self-pay | Admitting: Gastroenterology

## 2016-12-30 ENCOUNTER — Ambulatory Visit (AMBULATORY_SURGERY_CENTER): Payer: BLUE CROSS/BLUE SHIELD | Admitting: Gastroenterology

## 2016-12-30 VITALS — BP 89/62 | HR 74 | Temp 97.8°F | Resp 27 | Ht 60.0 in | Wt 139.0 lb

## 2016-12-30 DIAGNOSIS — D125 Benign neoplasm of sigmoid colon: Secondary | ICD-10-CM

## 2016-12-30 DIAGNOSIS — K635 Polyp of colon: Secondary | ICD-10-CM | POA: Diagnosis not present

## 2016-12-30 DIAGNOSIS — Z1211 Encounter for screening for malignant neoplasm of colon: Secondary | ICD-10-CM

## 2016-12-30 DIAGNOSIS — Z1212 Encounter for screening for malignant neoplasm of rectum: Secondary | ICD-10-CM

## 2016-12-30 MED ORDER — SODIUM CHLORIDE 0.9 % IV SOLN: 500.0000 mL | INTRAVENOUS | Status: DC

## 2016-12-30 NOTE — Op Note (Signed)
[facility] Patient Name: Ashley Mccann Procedure Date: 12/30/2016 9:37 AM MRN: [id number] Endoscopist: Ladene Artist , MD Age: 51 Referring MD:  Date of Birth: 1965/07/24 Gender: Female Account #: [id number] Procedure:                Colonoscopy Indications:              Screening for colorectal malignant neoplasm Medicines:                Monitored Anesthesia Care Procedure:                Pre-Anesthesia Assessment:                           - Prior to the procedure, a History and Physical                            was performed, and patient medications and                            allergies were reviewed. The patient's tolerance of                            previous anesthesia was also reviewed. The risks                            and benefits of the procedure and the sedation                            options and risks were discussed with the patient.                            All questions were answered, and informed consent                            was obtained. Prior Anticoagulants: The patient has                            taken no previous anticoagulant or antiplatelet                            agents. ASA Grade Assessment: II - A patient with                            mild systemic disease. After reviewing the risks                            and benefits, the patient was deemed in                            satisfactory condition to undergo the procedure.                           After obtaining informed consent, the colonoscope  was passed under direct vision. Throughout the                            procedure, the patient's blood pressure, pulse, and                            oxygen saturations were monitored continuously. The                            Colonoscope was introduced through the anus and                            advanced to the the cecum, identified by                            appendiceal orifice and  ileocecal valve. The                            ileocecal valve, appendiceal orifice, and rectum                            were photographed. The quality of the bowel                            preparation was excellent. The colonoscopy was                            performed without difficulty. The patient tolerated                            the procedure well. Scope In: 3:87:56 AM Scope Out: 9:57:37 AM Scope Withdrawal Time: 0 hours 9 minutes 52 seconds  Total Procedure Duration: 0 hours 11 minutes 19 seconds  Findings:                 The perianal and digital rectal examinations were                            normal.                           A 4 mm polyp was found in the sigmoid colon. The                            polyp was sessile. The polyp was removed with a                            cold biopsy forceps. Resection and retrieval were                            complete.                           The exam was otherwise without abnormality on  direct and retroflexion views. Complications:            No immediate complications. Estimated blood loss:                            None. Estimated Blood Loss:     Estimated blood loss: none. Impression:               - One 4 mm polyp in the sigmoid colon, removed with                            a cold biopsy forceps. Resected and retrieved.                           - The examination was otherwise normal on direct                            and retroflexion views. Recommendation:           - Repeat colonoscopy in 5 years for surveillance if                            polyp is precancerous otherwise 10 years.                           - Patient has a contact number available for                            emergencies. The signs and symptoms of potential                            delayed complications were discussed with the                            patient. Return to normal activities tomorrow.                             Written discharge instructions were provided to the                            patient.                           - Resume previous diet.                           - Continue present medications.                           - Await pathology results. Ladene Artist, MD 12/30/2016 10:00:39 AM This report has been signed electronically.

## 2016-12-30 NOTE — Progress Notes (Signed)
Called to room to assist during endoscopic procedure.  Patient ID and intended procedure confirmed with present staff. Received instructions for my participation in the procedure from the performing physician.  

## 2016-12-30 NOTE — Patient Instructions (Signed)
YOU HAD AN ENDOSCOPIC PROCEDURE TODAY AT [facility]:   Refer to the procedure report that was given to you for any specific questions about what was found during the examination.  If the procedure report does not answer your questions, please call your gastroenterologist to clarify.  If you requested that your care partner not be given the details of your procedure findings, then the procedure report has been included in a sealed envelope for you to review at your convenience later.  YOU SHOULD EXPECT: Some feelings of bloating in the abdomen. Passage of more gas than usual.  Walking can help get rid of the air that was put into your GI tract during the procedure and reduce the bloating. If you had a lower endoscopy (such as a colonoscopy or flexible sigmoidoscopy) you may notice spotting of blood in your stool or on the toilet paper. If you underwent a bowel prep for your procedure, you may not have a normal bowel movement for a few days.  Please Note:  You might notice some irritation and congestion in your nose or some drainage.  This is from the oxygen used during your procedure.  There is no need for concern and it should clear up in a day or so.  SYMPTOMS TO REPORT IMMEDIATELY:   Following lower endoscopy (colonoscopy or flexible sigmoidoscopy):  Excessive amounts of blood in the stool  Significant tenderness or worsening of abdominal pains  Swelling of the abdomen that is new, acute  Fever of 100F or higher  )For urgent or emergent issues, a gastroenterologist can be reached at any hour by calling [phone].   DIET:  We do recommend a small meal at first, but then you may proceed to your regular diet.  Drink plenty of fluids but you should avoid alcoholic beverages for 24 hours.  ACTIVITY:  You should plan to take it easy for the rest of today and you should NOT DRIVE or use heavy machinery until tomorrow (because of the sedation medicines used during the test).     FOLLOW UP: Our staff will call the number listed on your records the next business day following your procedure to check on you and address any questions or concerns that you may have regarding the information given to you following your procedure. If we do not reach you, we will leave a message.  However, if you are feeling well and you are not experiencing any problems, there is no need to return our call.  We will assume that you have returned to your regular daily activities without incident.  If any biopsies were taken you will be contacted by phone or by letter within the next 1-3 weeks.  Please call us at [phone] if you have not heard about the biopsies in 3 weeks.    SIGNATURES/CONFIDENTIALITY: You and/or your care partner have signed paperwork which will be entered into your electronic medical record.  These signatures attest to the fact that that the information above on your After Visit Summary has been reviewed and is understood.  Full responsibility of the confidentiality of this discharge information lies with you and/or your care-partne  Polyp information given.

## 2016-12-30 NOTE — Progress Notes (Signed)
Report given to PACU, vss 

## 2016-12-31 ENCOUNTER — Telehealth: Payer: Self-pay | Admitting: *Deleted

## 2016-12-31 NOTE — Telephone Encounter (Signed)
No answer. Number identifier. Message left to call if questions or concerns and we would make an attempt to call later in the day. 

## 2016-12-31 NOTE — Telephone Encounter (Signed)
  Follow up Call-  Call back number 12/30/2016  Post procedure Call Back phone  # [phone]  Permission to leave phone message Yes  Some recent data might be hidden     Patient questions:  Do you have a fever, pain , or abdominal swelling? No. Pain Score  0 *  Have you tolerated food without any problems? Yes.    Have you been able to return to your normal activities? Yes.    Do you have any questions about your discharge instructions: Diet   No. Medications  No. Follow up visit  No.  Do you have questions or concerns about your Care? No.  Actions: * If pain score is 4 or above: No action needed, pain <4.

## 2017-01-14 ENCOUNTER — Encounter: Payer: Self-pay | Admitting: Gastroenterology

## 2020-05-03 ENCOUNTER — Encounter: Payer: Self-pay | Admitting: Internal Medicine
